# Patient Record
Sex: Female | Born: 1950 | Race: White | Hispanic: No | Smoking: Former smoker
Health system: Southern US, Community
[De-identification: ages and names within clinical notes are randomized; demographics above are authoritative.]

## PROBLEM LIST (undated history)

## (undated) DIAGNOSIS — T4145XA Adverse effect of unspecified anesthetic, initial encounter: Secondary | ICD-10-CM

## (undated) DIAGNOSIS — T8859XA Other complications of anesthesia, initial encounter: Secondary | ICD-10-CM

## (undated) DIAGNOSIS — T7840XA Allergy, unspecified, initial encounter: Secondary | ICD-10-CM

## (undated) DIAGNOSIS — M549 Dorsalgia, unspecified: Secondary | ICD-10-CM

## (undated) DIAGNOSIS — E785 Hyperlipidemia, unspecified: Secondary | ICD-10-CM

## (undated) DIAGNOSIS — G8929 Other chronic pain: Secondary | ICD-10-CM

## (undated) DIAGNOSIS — F329 Major depressive disorder, single episode, unspecified: Secondary | ICD-10-CM

## (undated) DIAGNOSIS — F419 Anxiety disorder, unspecified: Secondary | ICD-10-CM

## (undated) DIAGNOSIS — Z9889 Other specified postprocedural states: Secondary | ICD-10-CM

## (undated) DIAGNOSIS — G4700 Insomnia, unspecified: Secondary | ICD-10-CM

## (undated) DIAGNOSIS — K8020 Calculus of gallbladder without cholecystitis without obstruction: Secondary | ICD-10-CM

## (undated) DIAGNOSIS — F32A Depression, unspecified: Secondary | ICD-10-CM

## (undated) DIAGNOSIS — D649 Anemia, unspecified: Secondary | ICD-10-CM

## (undated) DIAGNOSIS — M1990 Unspecified osteoarthritis, unspecified site: Secondary | ICD-10-CM

## (undated) DIAGNOSIS — R001 Bradycardia, unspecified: Secondary | ICD-10-CM

## (undated) DIAGNOSIS — I10 Essential (primary) hypertension: Secondary | ICD-10-CM

## (undated) DIAGNOSIS — J40 Bronchitis, not specified as acute or chronic: Secondary | ICD-10-CM

## (undated) DIAGNOSIS — R112 Nausea with vomiting, unspecified: Secondary | ICD-10-CM

## (undated) DIAGNOSIS — K5790 Diverticulosis of intestine, part unspecified, without perforation or abscess without bleeding: Secondary | ICD-10-CM

## (undated) HISTORY — PX: HERNIA REPAIR: SHX51

## (undated) HISTORY — DX: Insomnia, unspecified: G47.00

## (undated) HISTORY — DX: Allergy, unspecified, initial encounter: T78.40XA

## (undated) HISTORY — PX: APPENDECTOMY: SHX54

## (undated) HISTORY — PX: OTHER SURGICAL HISTORY: SHX169

## (undated) HISTORY — DX: Bradycardia, unspecified: R00.1

## (undated) HISTORY — PX: KNEE SURGERY: SHX244

## (undated) HISTORY — PX: TUBAL LIGATION: SHX77

## (undated) HISTORY — DX: Hyperlipidemia, unspecified: E78.5

## (undated) HISTORY — PX: COLONOSCOPY: SHX174

## (undated) HISTORY — DX: Unspecified osteoarthritis, unspecified site: M19.90

## (undated) HISTORY — DX: Other specified postprocedural states: Z98.890

## (undated) HISTORY — DX: Major depressive disorder, single episode, unspecified: F32.9

## (undated) HISTORY — DX: Depression, unspecified: F32.A

---

## 1998-05-01 ENCOUNTER — Other Ambulatory Visit: Admission: RE | Admit: 1998-05-01 | Discharge: 1998-05-01 | Payer: Self-pay | Admitting: Obstetrics & Gynecology

## 1998-06-13 ENCOUNTER — Other Ambulatory Visit: Admission: RE | Admit: 1998-06-13 | Discharge: 1998-06-13 | Payer: Self-pay | Admitting: Gastroenterology

## 1999-07-11 ENCOUNTER — Other Ambulatory Visit: Admission: RE | Admit: 1999-07-11 | Discharge: 1999-07-11 | Payer: Self-pay | Admitting: Obstetrics & Gynecology

## 2000-10-12 ENCOUNTER — Other Ambulatory Visit: Admission: RE | Admit: 2000-10-12 | Discharge: 2000-10-12 | Payer: Self-pay | Admitting: Obstetrics & Gynecology

## 2001-11-09 ENCOUNTER — Other Ambulatory Visit: Admission: RE | Admit: 2001-11-09 | Discharge: 2001-11-09 | Payer: Self-pay | Admitting: Obstetrics & Gynecology

## 2003-01-19 ENCOUNTER — Other Ambulatory Visit: Admission: RE | Admit: 2003-01-19 | Discharge: 2003-01-19 | Payer: Self-pay | Admitting: Obstetrics and Gynecology

## 2003-12-11 DIAGNOSIS — Z9889 Other specified postprocedural states: Secondary | ICD-10-CM

## 2003-12-11 HISTORY — DX: Other specified postprocedural states: Z98.890

## 2004-03-04 ENCOUNTER — Other Ambulatory Visit: Admission: RE | Admit: 2004-03-04 | Discharge: 2004-03-04 | Payer: Self-pay | Admitting: Obstetrics & Gynecology

## 2004-08-27 ENCOUNTER — Encounter: Admission: RE | Admit: 2004-08-27 | Discharge: 2004-08-27 | Payer: Self-pay | Admitting: Family Medicine

## 2005-02-11 ENCOUNTER — Ambulatory Visit (HOSPITAL_COMMUNITY): Admission: RE | Admit: 2005-02-11 | Discharge: 2005-02-11 | Payer: Self-pay | Admitting: Interventional Cardiology

## 2005-05-05 ENCOUNTER — Other Ambulatory Visit: Admission: RE | Admit: 2005-05-05 | Discharge: 2005-05-05 | Payer: Self-pay | Admitting: Obstetrics & Gynecology

## 2005-08-11 ENCOUNTER — Encounter: Admission: RE | Admit: 2005-08-11 | Discharge: 2005-08-11 | Payer: Self-pay | Admitting: Family Medicine

## 2006-12-09 ENCOUNTER — Inpatient Hospital Stay (HOSPITAL_COMMUNITY): Admission: EM | Admit: 2006-12-09 | Discharge: 2006-12-13 | Payer: Self-pay | Admitting: Emergency Medicine

## 2006-12-09 ENCOUNTER — Encounter (INDEPENDENT_AMBULATORY_CARE_PROVIDER_SITE_OTHER): Payer: Self-pay | Admitting: General Surgery

## 2006-12-17 ENCOUNTER — Encounter: Admission: RE | Admit: 2006-12-17 | Discharge: 2006-12-17 | Payer: Self-pay | Admitting: General Surgery

## 2006-12-20 ENCOUNTER — Inpatient Hospital Stay (HOSPITAL_COMMUNITY): Admission: EM | Admit: 2006-12-20 | Discharge: 2006-12-25 | Payer: Self-pay | Admitting: General Surgery

## 2006-12-20 ENCOUNTER — Encounter: Admission: RE | Admit: 2006-12-20 | Discharge: 2006-12-20 | Payer: Self-pay | Admitting: General Surgery

## 2008-03-27 ENCOUNTER — Ambulatory Visit (HOSPITAL_COMMUNITY): Admission: RE | Admit: 2008-03-27 | Discharge: 2008-03-28 | Payer: Self-pay | Admitting: Neurological Surgery

## 2008-12-03 IMAGING — CT CT PELVIS W/ CM
2 of 5 series · 17 of 46 positions shown, 19 images · IV contrast (READICAT/WATER & [ID] OMNI 300)
Comparison: 12/08/2006

ABDOMEN CT WITH CONTRAST

CLINICAL DATA: Abdominal pain, fever. Two weeks following appendectomy.
TECHNIQUE: Multidetector CT imaging of the abdomen and pelvis was performed
following the standard protocol during bolus administration of intravenous
contrast.

Contrast:  100 cc Omnipaque 300

[Series 3: routine abdomen · axial · 0.70mm/px · z∈[-324,+61]mm · 14 of 87 slices shown, 16 images]
[im 5/87  soft-tissue]
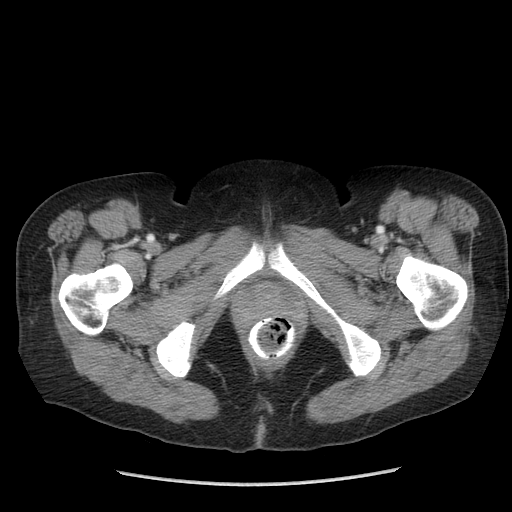
[im 5/87  bone]
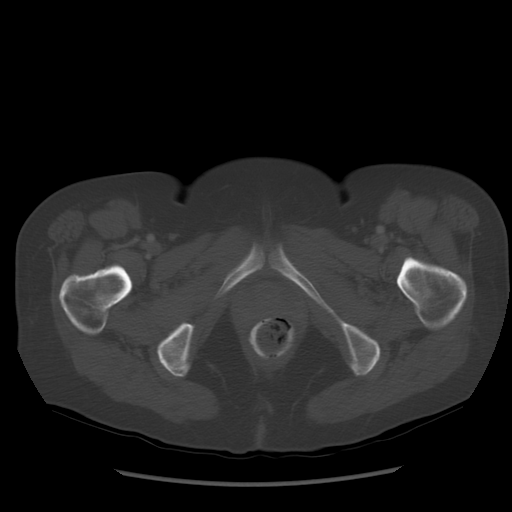
[im 10/87  soft-tissue]
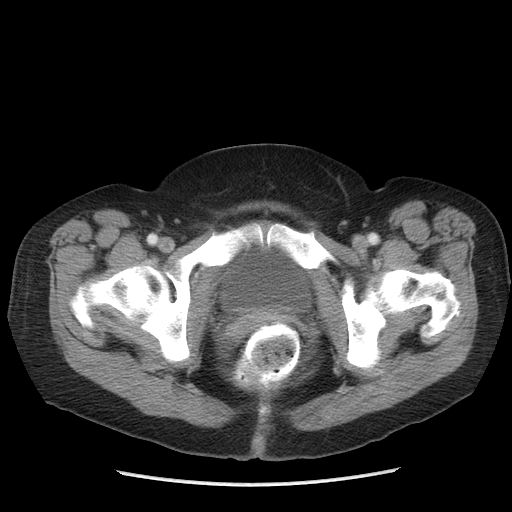
[im 20/87  soft-tissue]
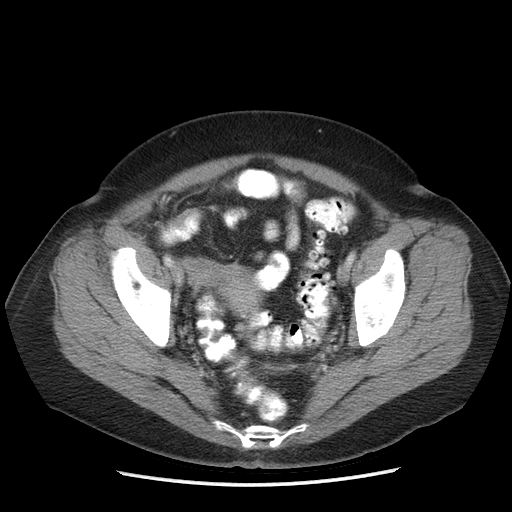
[im 24/87  soft-tissue]
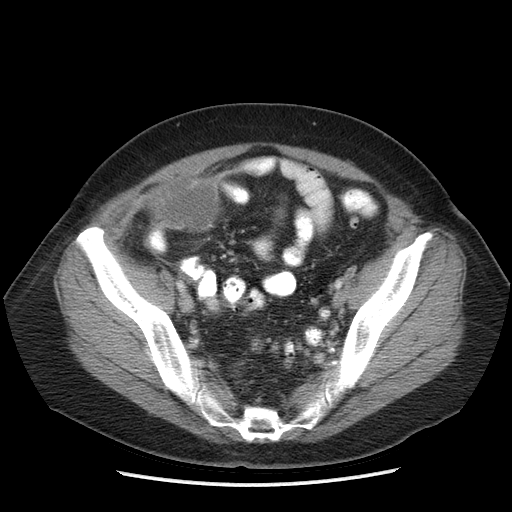
[im 29/87  soft-tissue]
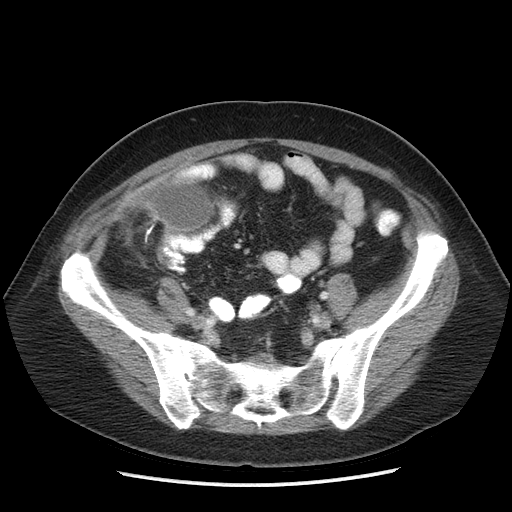
[im 34/87  soft-tissue]
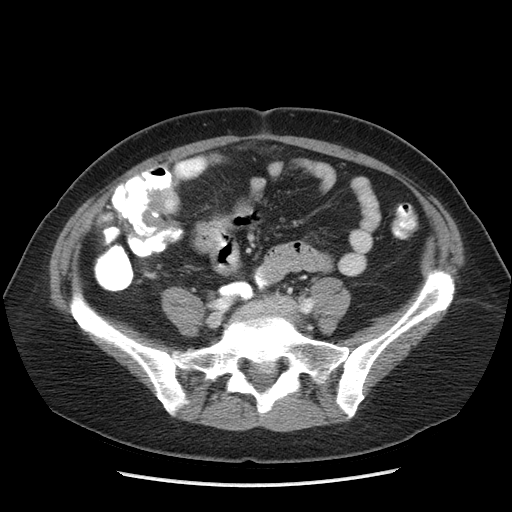
[im 39/87  soft-tissue]
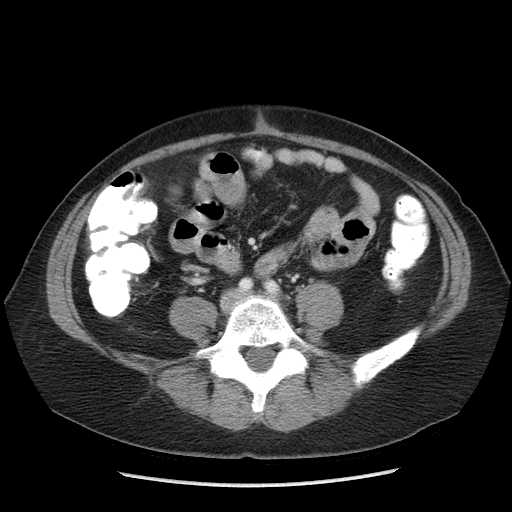
[im 48/87  soft-tissue]
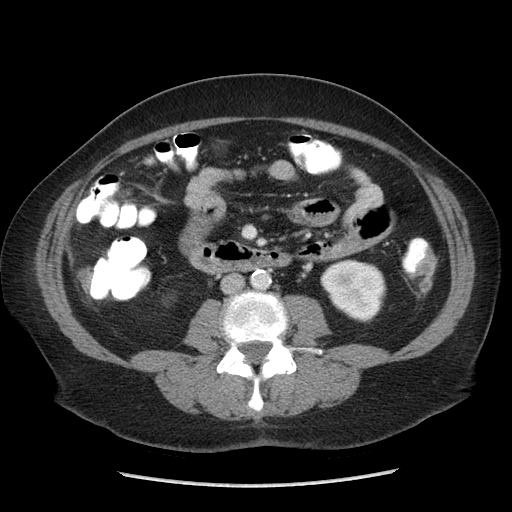
[im 53/87  soft-tissue]
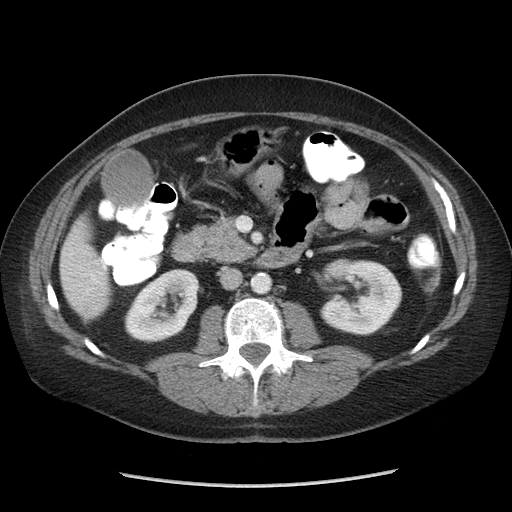
[im 53/87  bone]
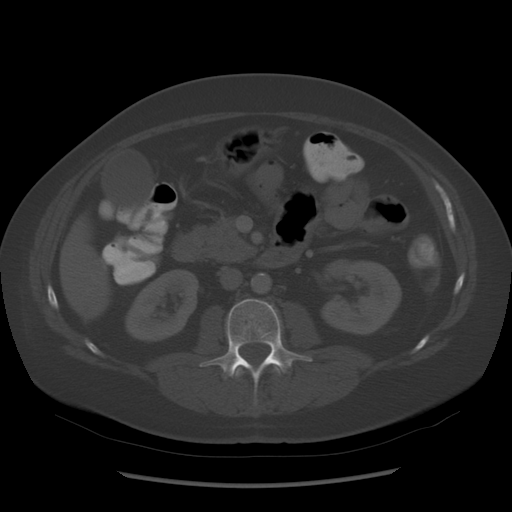
[im 58/87  soft-tissue]
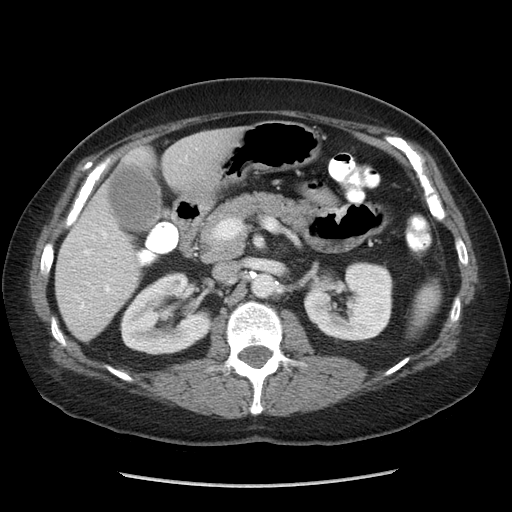
[im 63/87  soft-tissue]
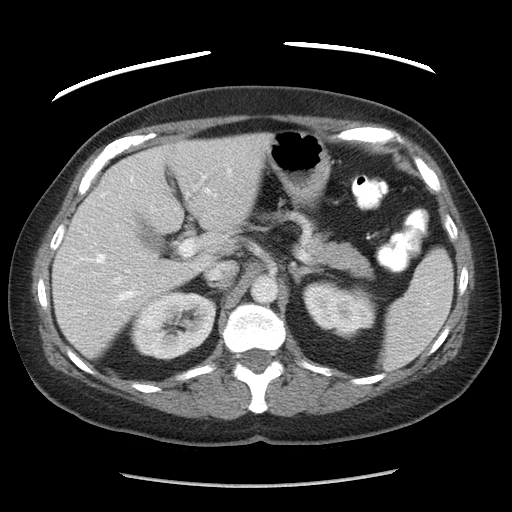
[im 67/87  soft-tissue]
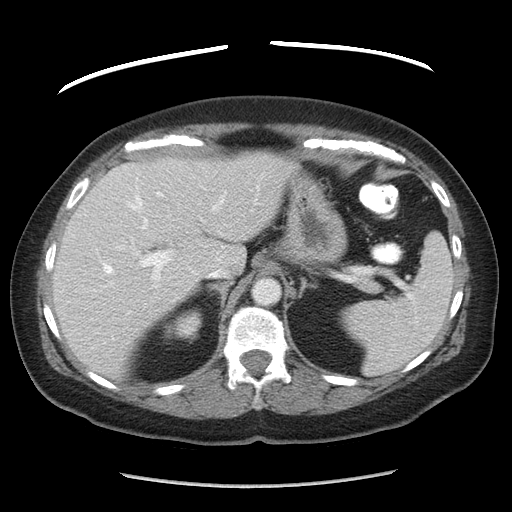
[im 77/87  soft-tissue]
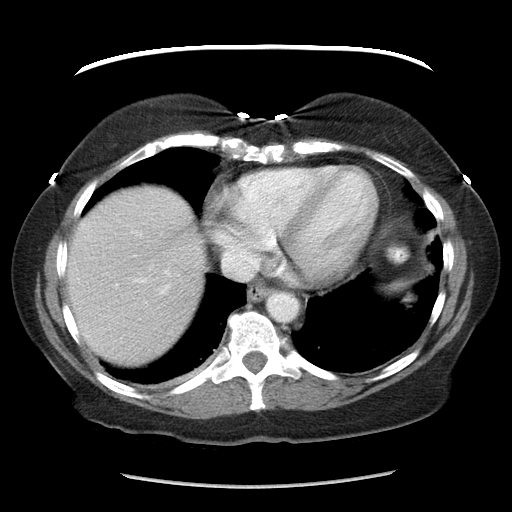
[im 82/87  soft-tissue]
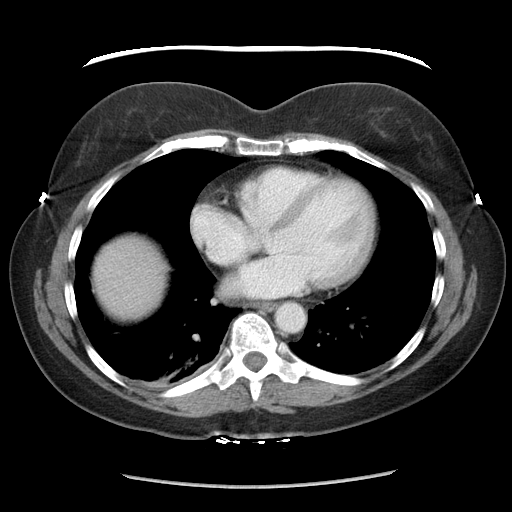

[Series 602: sagittal body · sagittal · 0.85mm/px · 3 of 145 slices shown]
[im 49/145  soft-tissue]
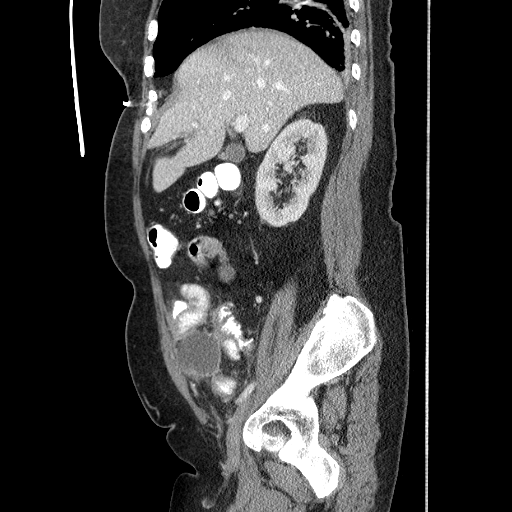
[im 65/145  soft-tissue]
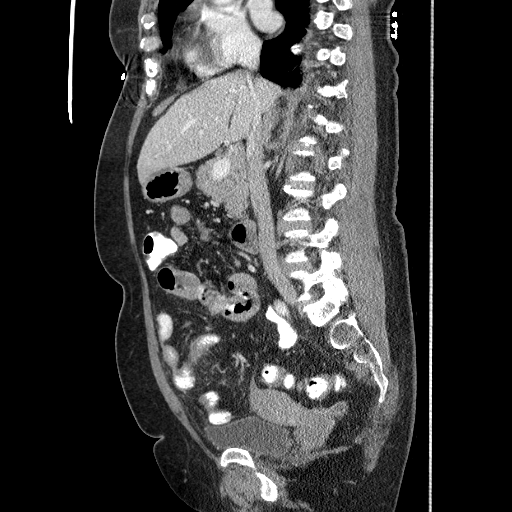
[im 81/145  soft-tissue]
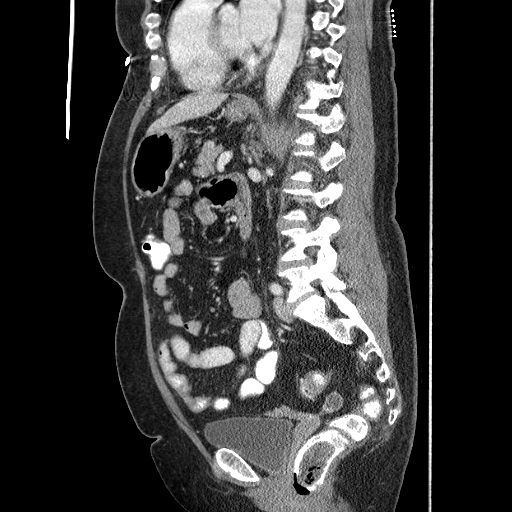

[17 of 46 positions shown; findings below may reference images not displayed]

FINDINGS: Liver, spleen, pancreas, adrenals, kidneys, gallbladder are
unremarkable. Bowel grossly unremarkable.

Immediately inferior to the liver, there is a tiny fluid collection with rim
enhancement, measuring 11 mm in greatest diameter. Similar tiny fluid collection
noted within the left paracolic gutter posterior to the descending colon,
measuring 11 mm in greatest diameter, with rim enhancement. These likely
represent small abscesses.

There is bibasilar atelectasis. No acute bony abnormality.

IMPRESSION

Tiny abscesses inferior to the right liver edge and in the left paracolic
gutter, both both measuring 11 mm in greatest diameter.

Bibasilar atelectasis.

PELVIS CT WITH CONTRAST
FINDINGS: There is an anterior right lower quadrant fluid collection, adjacent
to surgical suture line, which measures 6.1 x 3.1 cm, compatible with postop
abscess. Fluid collection is also seen in the cul-de-sac. This has the
suggestion of rim enhancement. This fluid collection measures 5.2 x 2.3 cm, and
could represent free fluid or developing abscess. Uterus is unremarkable. Bowel
grossly unremarkable except for sigmoid diverticulosis.

IMPRESSION

6.1 cm abscess in the right lower quadrant, adjacent to the surgical suture
line.

Fluid within the cul-de-sac. This is difficult to determine if this is a pocket
of free fluid or could represent a developing abscess.

Sigmoid diverticulosis.

## 2010-03-02 ENCOUNTER — Encounter: Payer: Self-pay | Admitting: Sports Medicine

## 2010-05-12 ENCOUNTER — Other Ambulatory Visit: Payer: Self-pay | Admitting: Obstetrics & Gynecology

## 2010-05-27 LAB — POCT I-STAT 4, (NA,K, GLUC, HGB,HCT)
Glucose, Bld: 104 mg/dL — ABNORMAL HIGH (ref 70–99)
HCT: 36 % (ref 36.0–46.0)
Hemoglobin: 12.2 g/dL (ref 12.0–15.0)
Potassium: 3.9 mEq/L (ref 3.5–5.1)
Sodium: 144 mEq/L (ref 135–145)

## 2010-05-27 LAB — CBC
HCT: 39.9 % (ref 36.0–46.0)
Hemoglobin: 14.1 g/dL (ref 12.0–15.0)
MCHC: 35.3 g/dL (ref 30.0–36.0)
MCV: 87.7 fL (ref 78.0–100.0)
Platelets: 229 10*3/uL (ref 150–400)
RBC: 4.55 MIL/uL (ref 3.87–5.11)
RDW: 13.4 % (ref 11.5–15.5)
WBC: 6.4 10*3/uL (ref 4.0–10.5)

## 2010-06-24 NOTE — H&P (Signed)
[name], [name]                ACCOUNT NO.:  [id number]   MEDICAL RECORD NO.:  [id number]          PATIENT TYPE:  INP   LOCATION:  1523                         FACILITY:  [facility]   PHYSICIAN:  Lennie Muckle, MD      DATE OF BIRTH:  12/03/1950   DATE OF ADMISSION:  12/20/2006  DATE OF DISCHARGE:                              HISTORY & PHYSICAL   The patient is a 60 year old female who began having fevers on Sunday  evening up to 102 that continued this Monday morning, December 21, 2006.  She had an appendectomy for necrotic appendix and appendicitis on  December 09, 2006.  She was given IV antibiotics and changed to oral  medications which she has concluded her Augmentin 875 mg  postoperatively.  She has vague abdominal pain and I had seen her in the  office on Friday due to complaints of right-sided pleuritic chest pain.  A chest x-ray revealed atelectasis only.  Due to the amount of her  fevers, I obtained a CT scan as an outpatient which revealed an abscess  in the vicinity of the previous appendectomy approximately 6 cm in size.   PAST MEDICAL HISTORY:  Previous appendicitis requiring appendectomy on  December 09, 2006.  History of depression and hyperlipidemia.   PAST SURGICAL HISTORY:  Appendectomy on December 09, 2006.  Bilateral  tubal ligation.   SOCIAL HISTORY:  No tobacco or alcohol use.   MEDICATIONS:  1. Lexapro.  2. Trazodone.  3. Lipitor.  4. Xanax.  5. Excedrin.  6. Ibuprofen.  7. Caltrate.  8. Fish Oil.  9. She has also been given MiraLax, Percocet, and Phenergan with her      last hospitalization.   REVIEW OF SYSTEMS:  Obtained from the patient.  No change since her last  hospitalization.   PHYSICAL EXAMINATION:  VITAL SIGNS:  Temperature currently 98.5, pulse  62, blood pressure 137/64.  GENERAL:  Overall she looks well and is in no acute distress.  ABDOMEN:  Soft.  Minimal tenderness in the right lower quadrant.  No  peritoneal signs.  SKIN:  Without  any abnormalities.   ASSESSMENT:  Postoperative abscess from previous appendectomy, 6 cm in  size.  I will have interventional radiology place percutaneous drainage  of the abscess, place her on IV antibiotics.  Hopefully she will be able  to be discharged home with her drain in the next couple of days once her  fevers have resolved and will have drained her abscess.      Lennie Muckle, MD  Electronically Signed     ALA/MEDQ  D:  12/20/2006  T:  12/21/2006  Job:  [id number]

## 2010-06-24 NOTE — Discharge Summary (Signed)
NAMESAVAYA, HAKES                ACCOUNT NO.:  [id number]   MEDICAL RECORD NO.:  [id number]          PATIENT TYPE:  INP   LOCATION:  1342                         FACILITY:  [facility]   PHYSICIAN:  Lennie Muckle, MD      DATE OF BIRTH:  May 23, 1950   DATE OF ADMISSION:  12/09/2006  DATE OF DISCHARGE:  12/13/2006                               DISCHARGE SUMMARY   DIAGNOSIS:  Appendicitis with a necrotic appendix status post  laparoscopic appendectomy.   [name] Riley is a 60 year old female who was admitted following a  laparoscopic appendectomy performed on 12/11/06.  She had presented to  the emergency room on 12/09/06 with abdominal pain, was found to have  acute appendicitis.  Following her procedure, she was kept on Zosyn IV  due to the necrotic nature of the appendix and fluid within her abdomen.  She has progressed nicely, becoming afebrile, and her white count is  down to 10.9.  She did have a postoperative ileus but is eating well  today without incident.  Pain has been fairly well controlled.  I will  DC her home on Percocet.  Instructed her to take MiraLax for aid with  constipation, give her 5 days of Augmentin 875 mg, and she will follow  up with me in approximately 2 or 3 weeks.  She is released from work  until November the 12th.      Lennie Muckle, MD  Electronically Signed     ALA/MEDQ  D:  12/13/2006  T:  12/14/2006  Job:  [id number]

## 2010-06-24 NOTE — Op Note (Signed)
[name], [name]                ACCOUNT NO.:  [id number]   MEDICAL RECORD NO.:  [id number]          PATIENT TYPE:  OIB   LOCATION:  3599                         FACILITY:  [facility]   PHYSICIAN:  Stefani Dama, M.D.  DATE OF BIRTH:  1950-12-16   DATE OF PROCEDURE:  03/27/2008  DATE OF DISCHARGE:                               OPERATIVE REPORT   PREOPERATIVE DIAGNOSIS:  Herniated nucleus pulposus, C6-C7 right with  the right cervical radiculopathy.   POSTOPERATIVE DIAGNOSIS:  Herniated nucleus pulposus, C6-C7 right with  the right cervical radiculopathy.   PROCEDURE:  Sitting cervical laminotomy and diskectomy, C6-C7 with  METRx, operating microscope, microdissection technique.   SURGEON:  Stefani Dama, MD   FIRST ASSISTANT:  Danae Orleans. Venetia Maxon, MD   ANESTHESIA:  General endotracheal.   INDICATIONS:  [name] Riley is a 60 year old individual who has had  significant neck, shoulder, and right arm pain and weakness.  She has  evidence of a herniated nucleus pulposus at C6-C7 on the right side.  She has been having right arm pain and weakness now for 2 months time,  despite efforts at conservative management.  She is advised regarding  surgical intervention.   PROCEDURE IN DETAIL:  The patient was brought to the operating room,  placed on table in supine position.  After placement of appropriate  arterial and central venous monitoring lines, she was placed under  general anesthesia and then carefully placed in the sitting position in  a 3-pin headrest.  Back of the neck was then prepped with alcohol and  DuraPrep and localization was obtained with fluoroscopy at the C6-C7  level.  The skin was infiltrated with a total of 15 mL of 0.5% Marcaine,  1% lidocaine with epinephrine, and a small vertical incision was created  overlying the area of C6-C7.  K-wire was placed to the interlaminar  space at C6-C7 on the right side overlying the facet joints.  A winding  technique was then  used to dilate and resect the soft tissues overlying  the interlaminar space and then a series of dilators were used to open  this to an 18-mm diameter.  A 16-mm wide x 6-cm deep endoscopic cannula  was then fixed to the operating table with a clamp and operating  microscope was used then to dissect the interlaminar space at C6-C7 off  to the right side.  Soft tissues were cleared with a monopolar cautery  and then a high-speed drill was used to create a small laminotomy, the  medial facetectomy at C6-C7 level.  The C7 nerve root was identified and  carefully cauterized.  The area was then inspected and with the use of  the operating microscope some epidural veins were cauterized and  divided.  The C7 nerve root was then carefully protected and elevated.  This was noted be a substantial mass underneath the ligament.  By  providing some retraction on the nerve root superiorly and medially, the  disk space could be opened with a #15 blade and immediately under  moderate amount of pressure, some disk material was extruded itself.  This was removed with a micro pituitary and further dissection in this  area yielded several other fragments of disk that presented themselves  through this small opening.  Ultimately, this allowed for good  decompression of the C7 nerve root.  Once all the disk material was  removed from this area, that is all the disk material that could be  milked back from the subligamentous space both medially and laterally,  it was decided to terminate the procedure.  The C7 nerve root was noted  to lie more comfortably in the foramen.  There was no pressure from the  undersurface of the nerve root.  With this, the microscope was removed,  the endoscopic cannula was removed, the soft tissues were then checked  for hemostasis, and then the subcutaneous  tissue was closed with 3-0 Vicryl interrupted fashion, and 3-0 Vicryl  was used in the subcuticular tissues.  Dermabond was  placed on the skin.  The patient tolerated the procedure well and was returned to the  recovery room in stable condition.      Stefani Dama, M.D.  Electronically Signed     HJE/MEDQ  D:  03/27/2008  T:  03/27/2008  Job:  [id number]

## 2010-06-24 NOTE — Op Note (Signed)
[name] Riley, [name] Riley                ACCOUNT NO.:  [id number]   MEDICAL RECORD NO.:  [id number]          PATIENT TYPE:  EMS   LOCATION:  ED                           FACILITY:  [facility]   PHYSICIAN:  Lennie Muckle, MD      DATE OF BIRTH:  1950/11/16   DATE OF PROCEDURE:  12/09/2006  DATE OF DISCHARGE:                               OPERATIVE REPORT   PREOPERATIVE DIAGNOSIS:  Acute appendicitis.   POSTOPERATIVE DIAGNOSIS:  Acute appendicitis.   PROCEDURE:  Laparoscopic appendectomy.   SURGEON:  Lennie Muckle, M.D.   ASSISTANT:  None.   SPECIMENS:  Appendix.   ANESTHESIA:  General endotracheal.   ESTIMATED BLOOD LOSS:  Minimal.   COMPLICATIONS:  None immediate.   DRAINS:  None.   INDICATIONS FOR PROCEDURE:  The patient is a 60 year old female who  presented to the emergency department with right lower quadrant pain.  Clinical examination as well as CT findings indicated acute  appendicitis.  Informed consent was obtained.   DESCRIPTION OF PROCEDURE:  The patient was identified in the  preoperative holding suite.  She was then taken to the operating room.  She was placed under general endotracheal anesthesia.  A timeout  procedure was performed indicating the patient and the procedure.  She  was given Zosyn preoperatively in the emergency room at approximately  11:30.  She was taken to the operating room at approximately 1 a.m.  Her  abdomen was prepped and draped in the usual sterile fashion.  Due to the  previous incision inferiorly around the umbilicus, I chose an area  superior.  I anesthetized the skin with 0.25% Marcaine and 1% lidocaine.  After incision, placed a #12 mm trocar with direct visualization with  OptiVu.  Upon entry into the abdomen, there did appear to be an adhesion  close to the umbilical port.  No other adhesions were noted.  There was  inflammation in the right lower quadrant.  A 5 mm trocar was placed in  the left lower quadrant.  An additional 5 mm  trocar placed in the right  upper quadrant.  Upon further inspection around the cecal area, there  was purulent amount of fluid.  The appendix was able to be visualized  with a central area of necrosis.  There was also inflammatory reaction  to the small intestine in the vicinity of the appendix.  Using blunt  dissection, the base of the appendix was freed to allow laparoscopic  stapler to be applied at the base of the appendix.  The mesentery was  also stapled with laparoscopic stapling device.  The appendix was placed  in an EndoCatch bag and removed from the umbilical port.  The abdomen  was irrigated with 3 liters of saline until no further purulent fluid  was noted in the pelvis or in the right side of the abdomen.  There was  no evidence of  bleeding at the end of the case.  The fascia was closed with 0 Vicryl  suture.  The skin was closed with 4-0 Monocryl.  Steri-Strips were  placed for  final dressing.  The patient was extubated and transported to  the postanesthesia care unit in stable condition.      Lennie Muckle, MD  Electronically Signed     ALA/MEDQ  D:  12/09/2006  T:  12/09/2006  Job:  [id number]

## 2010-06-24 NOTE — Discharge Summary (Signed)
NAMELUCERITO, ROSINSKI                ACCOUNT NO.:  [id number]   MEDICAL RECORD NO.:  [id number]          PATIENT TYPE:  INP   LOCATION:  1523                         [facility]ACILITY:  [facility]   PHYSICIAN:  Lennie Muckle, MD      DATE OF BIRTH:  01-Feb-1951   DATE OF ADMISSION:  12/20/2006  DATE OF DISCHARGE:  12/25/2006                               DISCHARGE SUMMARY   DIAGNOSIS:  Abdominal abscess following appendectomy.   Ms. Youse is a 60 year old female who had had fevers following 1 week  ago to 102.  CT scan revealed fluid collection in the right lower  quadrant adjacent to the previous appendectomy site.  She was admitted  for IV antibiotics, had a drain placed by interventional radiology on  December 21, 2006, has had successful management of her abscess with  drainage.  Repeat CT scan today on Saturday, November 15th, reveals no  residual fluid collection.  Drain will be discontinued by radiology.  She has had alleviation of her right-sided abdominal pain, has had no  fevers for the past several days, and white count is normal at 10.  She  will be discharged to continue on Levaquin and Flagyl for another 7  days.  She is instructed to follow up with me in approximately 2 weeks'  time.  She may shower.  No heavy lifting for 2 more weeks and then we  will reevaluate her restrictions when she comes in to clinic visit.  She  will continue all of her home medications of Lexapro, trazodone,  Lipitor, Xanax, MiraLax, Percocet, ibuprofen, Caltrate, and fish oil.      Lennie Muckle, MD  Electronically Signed     ALA/MEDQ  D:  12/25/2006  T:  12/26/2006  Job:  [id number]

## 2010-06-24 NOTE — H&P (Signed)
[name] Riley, [name] Riley                ACCOUNT NO.:  [id number]   MEDICAL RECORD NO.:  [id number]          PATIENT TYPE:  EMS   LOCATION:  ED                           FACILITY:  [facility]   PHYSICIAN:  Lennie Muckle, MD      DATE OF BIRTH:  August 26, 1950   DATE OF ADMISSION:  12/08/2006  DATE OF DISCHARGE:                              HISTORY & PHYSICAL   CHIEF COMPLAINT:  Abdominal pain.   Ms. Follmer is a 60 year old female who has had approximately 1-week  history of abdominal pain.  She states this started on Monday.  She  thought this was associated with the flu because she had nausea and  vomiting as well.  The pain worsened today along with fevers and chills.  She has some loose stools as well.  Due to her amount of abdomen pain,  as well as a chills, she came to the emergency department.  She also  reports having anorexia.  CT was obtained with findings of acute  appendicitis.   PAST MEDICAL HISTORY:  1. Anxiety.  2. Depression.  3. Hyperlipidemia.   PREVIOUS SURGICAL HISTORY:  She had bitubal ligations.   SOCIAL HISTORY:  No tobacco or alcohol use.   NO KNOWN DRUG ALLERGIES.   MEDICATIONS:  1. Lexapro.  2. Trazodone.  3. Lipitor.  4. Xanax.   REVIEW OF SYSTEMS:  Obtained from the patient.  Twelve-point system is  negative.  Per the patient, she has a diagnosis of bradycardia and has  apparently had a low blood pressure and heart rate following surgery in  the past.   PHYSICAL EXAMINATION:  Temperature is 101.  Pulse 68.  Respiratory rate  is 17.  Blood pressure 150/72.  She is lying in bed, is flushed, and is  not well appearing.  CHEST:  Clear to auscultation bilaterally.  CARDIOVASCULAR:  Regular rate and rhythm.  ABDOMEN:  Tender in palpation in the right lower quadrant with positive  peritoneal signs.  Positive Rovsing sign.  SKIN:  Flushed and hot to the touch.   CT, inflammation around the appendix with trace amount of fluid in the  pelvis.   LABS DRAWN IN  THE EMERGENCY ROOM:  White count was 16.6, hemoglobin and  hematocrit 14.3 and 40.2.  Serum chemistry is within normal limits.  Urinalysis is essentially negative.  She does have a small trace ketones  and protein is 30.   PLAN:  Appendicitis, receive IV antibiotics in the emergency department.   PLAN:  Laparoscopic appendectomy with discharge pending results of  operation.  I did discuss with the patient the possibility of a  postoperative abscess needing percutaneous drainage or IV antibiotics.      Lennie Muckle, MD  Electronically Signed     ALA/MEDQ  D:  12/08/2006  T:  12/09/2006  Job:  [id number]

## 2010-10-02 ENCOUNTER — Other Ambulatory Visit: Payer: Self-pay | Admitting: Family Medicine

## 2010-10-02 DIAGNOSIS — R109 Unspecified abdominal pain: Secondary | ICD-10-CM

## 2010-10-03 ENCOUNTER — Ambulatory Visit
Admission: RE | Admit: 2010-10-03 | Discharge: 2010-10-03 | Disposition: A | Discharge status: Home / Self Care | Payer: 59 | Source: Ambulatory Visit | Attending: Family Medicine | Admitting: Family Medicine

## 2010-10-03 DIAGNOSIS — R109 Unspecified abdominal pain: Secondary | ICD-10-CM

## 2010-11-18 LAB — CBC
HCT: 32.5 — ABNORMAL LOW
HCT: 33.1 — ABNORMAL LOW
Hemoglobin: 11.3 — ABNORMAL LOW
Hemoglobin: 11.6 — ABNORMAL LOW
MCHC: 34.9
MCHC: 34.9
MCV: 87.4
MCV: 88.2
Platelets: 565 — ABNORMAL HIGH
Platelets: 597 — ABNORMAL HIGH
RBC: 3.72 — ABNORMAL LOW
RBC: 3.76 — ABNORMAL LOW
RDW: 12
RDW: 12.5
WBC: 15.8 — ABNORMAL HIGH
WBC: 9.6

## 2010-11-18 LAB — CULTURE, ROUTINE-ABSCESS: Culture: NO GROWTH

## 2010-11-18 LAB — APTT: aPTT: 36

## 2010-11-18 LAB — ANAEROBIC CULTURE

## 2010-11-18 LAB — PROTIME-INR
INR: 1.1
Prothrombin Time: 14.3

## 2010-11-19 LAB — CBC
HCT: 37.5
HCT: 40.2
Hemoglobin: 12.9
Hemoglobin: 14.3
MCHC: 34.5
MCHC: 35.7
MCV: 87
MCV: 89.1
Platelets: 200
Platelets: 237
RBC: 4.21
RBC: 4.62
RDW: 12.4
RDW: 12.4
WBC: 10.9 — ABNORMAL HIGH
WBC: 16.6 — ABNORMAL HIGH

## 2010-11-19 LAB — URINALYSIS, ROUTINE W REFLEX MICROSCOPIC
Glucose, UA: NEGATIVE
Hgb urine dipstick: NEGATIVE
Nitrite: NEGATIVE
Protein, ur: 30 — AB
Specific Gravity, Urine: 1.026
Urobilinogen, UA: 0.2
pH: 7

## 2010-11-19 LAB — DIFFERENTIAL
Basophils Absolute: 0.1
Basophils Relative: 1
Eosinophils Absolute: 0
Eosinophils Relative: 0
Lymphocytes Relative: 7 — ABNORMAL LOW
Lymphs Abs: 1.1
Monocytes Absolute: 1.6 — ABNORMAL HIGH
Monocytes Relative: 10
Neutro Abs: 13.8 — ABNORMAL HIGH
Neutrophils Relative %: 83 — ABNORMAL HIGH

## 2010-11-19 LAB — COMPREHENSIVE METABOLIC PANEL
ALT: 32
AST: 22
Albumin: 3.9
Alkaline Phosphatase: 73
BUN: 6
CO2: 28
Calcium: 8.8
Chloride: 101
Creatinine, Ser: 0.76
GFR calc Af Amer: >60
GFR calc non Af Amer: >60
Glucose, Bld: 129 — ABNORMAL HIGH
Potassium: 4.1
Sodium: 138
Total Bilirubin: 2.5 — ABNORMAL HIGH
Total Protein: 6.9

## 2010-11-19 LAB — URINE MICROSCOPIC-ADD ON

## 2011-10-22 ENCOUNTER — Encounter (INDEPENDENT_AMBULATORY_CARE_PROVIDER_SITE_OTHER): Payer: Self-pay | Admitting: Surgery

## 2011-11-03 ENCOUNTER — Ambulatory Visit (INDEPENDENT_AMBULATORY_CARE_PROVIDER_SITE_OTHER): Payer: 59 | Admitting: Surgery

## 2011-11-03 ENCOUNTER — Encounter (INDEPENDENT_AMBULATORY_CARE_PROVIDER_SITE_OTHER): Payer: Self-pay | Admitting: Surgery

## 2011-11-03 VITALS — BP 120/78 | HR 60 | Temp 98.2°F | Resp 12 | Ht 62.0 in | Wt 165.0 lb

## 2011-11-03 DIAGNOSIS — K439 Ventral hernia without obstruction or gangrene: Secondary | ICD-10-CM | POA: Insufficient documentation

## 2011-11-03 NOTE — Progress Notes (Signed)
Patient ID: [name] Riley, female   DOB: 1950/07/06, 61 y.o.   MRN: [id number]  Chief Complaint  Patient presents with  . Incisional Hernia    HPI [name] Riley is a 61 y.o. female.  Referred by Dr. Merri Brunette HPI This is a 61 year old female who is status post bilateral tubal ligation through a small infraumbilical incision as well as a laparoscopic appendectomy for a perforated appendix in 2008 by Dr. Bertram Savin. She has developed a bulge above her umbilicus. This has enlarged and is starting to become more uncomfortable. She denies any obstructive symptoms. She has not had any imaging studies. She presents now for surgical evaluation.   Past Medical History  Diagnosis Date  . Hyperlipidemia   . Depression   . Allergy   . Insomnia   . DJD (degenerative joint disease)   . Bradycardia   . Hx of colonoscopy 12/2003    Past Surgical History  Procedure Date  . Btl   . Knee surgery     right knee  . Appendectomy   . Neck discectomy   . Tubal ligation     No family history on file.  Social History History  Substance Use Topics  . Smoking status: Former Smoker    Quit date: 05/09/1997  . Smokeless tobacco: Not on file  . Alcohol Use: No    Allergies  Allergen Reactions  . Codeine Nausea Only    Current Outpatient Prescriptions  Medication Sig Dispense Refill  . ALPRAZolam (XANAX) 0.25 MG tablet Take 0.25 mg by mouth at bedtime as needed.      Marland Kitchen amoxicillin (AMOXIL) 875 MG tablet Take 875 mg by mouth 2 (two) times daily.      Marland Kitchen atorvastatin (LIPITOR) 20 MG tablet Take 20 mg by mouth daily.      . Calcium Carbonate-Vitamin D (CALTRATE 600+D PO) Take by mouth.      . escitalopram (LEXAPRO) 20 MG tablet Take 20 mg by mouth daily.      . fish oil-omega-3 fatty acids 1000 MG capsule Take 2 g by mouth daily.      Marland Kitchen loratadine (CLARITIN) 10 MG tablet Take 10 mg by mouth daily.      . meloxicam (MOBIC) 15 MG tablet Take 15 mg by mouth daily.      . Multiple  Vitamins-Minerals (CENTRUM PO) Take by mouth.      . traZODone (DESYREL) 50 MG tablet Take 50 mg by mouth at bedtime.        Review of Systems Review of Systems  Constitutional: Negative for fever, chills and unexpected weight change.  HENT: Negative for hearing loss, congestion, sore throat, trouble swallowing and voice change.   Eyes: Negative for visual disturbance.  Respiratory: Negative for cough and wheezing.   Cardiovascular: Negative for chest pain, palpitations and leg swelling.  Gastrointestinal: Negative for nausea, vomiting, abdominal pain, diarrhea, constipation, blood in stool, abdominal distention and anal bleeding.  Genitourinary: Negative for hematuria, vaginal bleeding and difficulty urinating.  Musculoskeletal: Negative for arthralgias.  Skin: Negative for rash and wound.  Neurological: Negative for seizures, syncope and headaches.  Hematological: Negative for adenopathy. Does not bruise/bleed easily.  Psychiatric/Behavioral: Negative for confusion.    Blood pressure 120/78, pulse 60, temperature 98.2 F (36.8 C), temperature source Oral, resp. rate 12, height 5\' 2"  (1.575 m), weight 165 lb (74.844 kg).  Physical Exam Physical Exam WDWN in NAD HEENT:  EOMI, sclera anicteric Neck:  No masses, no thyromegaly Lungs:  CTA bilaterally; normal respiratory effort CV:  Regular rate and rhythm; no murmurs Abd:  +bowel sounds, soft, protruding mass above the umbilicus, as well as a smaller mass at the umbilicus.  There seems to be two separate fascial defects.  Both hernias are reducible. Ext:  Well-perfused; no edema Skin:  Warm, dry; no sign of jaundice  Data Reviewed none  Assessment    Ventral hernia - reducible    Plan    Ventral hernia repair with mesh.  The surgical procedure has been discussed with the patient.  Potential risks, benefits, alternative treatments, and expected outcomes have been explained.  All of the patient's questions at this time have  been answered.  The likelihood of reaching the patient's treatment goal is good.  The patient understand the proposed surgical procedure and wishes to proceed.        Mariadel Mruk K. 11/03/2011, 4:40 PM

## 2011-11-03 NOTE — Patient Instructions (Signed)
Call our surgery schedulers in October to schedule your surgery [phone].

## 2011-12-02 ENCOUNTER — Encounter (HOSPITAL_COMMUNITY): Payer: Self-pay | Admitting: Pharmacy Technician

## 2011-12-08 ENCOUNTER — Encounter (HOSPITAL_COMMUNITY): Payer: Self-pay

## 2011-12-08 ENCOUNTER — Encounter (HOSPITAL_COMMUNITY)
Admission: RE | Admit: 2011-12-08 | Discharge: 2011-12-08 | Disposition: A | Discharge status: Home / Self Care | Payer: 59 | Source: Ambulatory Visit | Attending: Surgery | Admitting: Surgery

## 2011-12-08 HISTORY — DX: Anemia, unspecified: D64.9

## 2011-12-08 HISTORY — DX: Other complications of anesthesia, initial encounter: T88.59XA

## 2011-12-08 HISTORY — DX: Diverticulosis of intestine, part unspecified, without perforation or abscess without bleeding: K57.90

## 2011-12-08 HISTORY — DX: Dorsalgia, unspecified: M54.9

## 2011-12-08 HISTORY — DX: Bronchitis, not specified as acute or chronic: J40

## 2011-12-08 HISTORY — DX: Adverse effect of unspecified anesthetic, initial encounter: T41.45XA

## 2011-12-08 HISTORY — DX: Other chronic pain: G89.29

## 2011-12-08 LAB — BASIC METABOLIC PANEL
BUN: 11 mg/dL (ref 6–23)
CO2: 29 mEq/L (ref 19–32)
Calcium: 9.6 mg/dL (ref 8.4–10.5)
Chloride: 106 mEq/L (ref 96–112)
Creatinine, Ser: 0.8 mg/dL (ref 0.50–1.10)
GFR calc Af Amer: 89 mL/min — ABNORMAL LOW (ref >90)
GFR calc non Af Amer: 77 mL/min — ABNORMAL LOW (ref >90)
Glucose, Bld: 127 mg/dL — ABNORMAL HIGH (ref 70–99)
Potassium: 3.7 mEq/L (ref 3.5–5.1)
Sodium: 145 mEq/L (ref 135–145)

## 2011-12-08 LAB — CBC
HCT: 39 % (ref 36.0–46.0)
Hemoglobin: 14.2 g/dL (ref 12.0–15.0)
MCH: 30.7 pg (ref 26.0–34.0)
MCHC: 36.4 g/dL — ABNORMAL HIGH (ref 30.0–36.0)
MCV: 84.4 fL (ref 78.0–100.0)
Platelets: 183 10*3/uL (ref 150–400)
RBC: 4.62 MIL/uL (ref 3.87–5.11)
RDW: 12.3 % (ref 11.5–15.5)
WBC: 6.4 10*3/uL (ref 4.0–10.5)

## 2011-12-08 LAB — SURGICAL PCR SCREEN
MRSA, PCR: NEGATIVE
Staphylococcus aureus: NEGATIVE

## 2011-12-08 MED ORDER — CHLORHEXIDINE GLUCONATE 4 % EX LIQD: 1.0000 "application " | Freq: Once | CUTANEOUS | Status: DC

## 2011-12-08 NOTE — Progress Notes (Signed)
Forwarded chart to anesthesia for review of EKG.

## 2011-12-08 NOTE — Pre-Procedure Instructions (Signed)
20 [name]  12/08/2011   Your procedure is scheduled on:  Thursday December 17, 2011  Report to [facility] at 7:00 AM.  Call this number if you have problems the morning of surgery: [phone]   Remember:   Do not eat food or drink :After Midnight.      Take these medicines the morning of surgery with A SIP OF WATER: xanax, zyrtec, lexapro,    Do not wear jewelry, make-up or nail polish.  Do not wear lotions, powders, or perfumes.  Do not shave 48 hours prior to surgery. Men may shave face and neck.  Do not bring valuables to the hospital.  Contacts, dentures or bridgework may not be worn into surgery.  Leave suitcase in the car. After surgery it may be brought to your room.  For patients admitted to the hospital, checkout time is 11:00 AM the day of discharge.   Patients discharged the day of surgery will not be allowed to drive home.  Name and phone number of your driver: family / friend  Special Instructions: Shower using CHG 2 nights before surgery and the night before surgery.  If you shower the day of surgery use CHG.  Use special wash - you have one bottle of CHG for all showers.  You should use approximately 1/3 of the bottle for each shower.   Please read over the following fact sheets that you were given: Pain Booklet, Coughing and Deep Breathing, MRSA Information and Surgical Site Infection Prevention

## 2011-12-09 NOTE — Consult Note (Signed)
Anesthesia Chart Review:  Patient is a 61 year old female scheduled for [facility] with mesh on 12/17/11 by Dr. Corliss Skains.  History includes HLD, former smoker, obesity, anemia, depression, chronic back pain, DJD, diverticulosis, tubal ligation, appendectomy.  She reports that she has bradycardia and is hard to wake up after anesthesia.  CBC and BMET on 12/08/11 noted.  EKG on 12/08/11 showed SB @ 54 bpm, anterior ST/T wave abnormality, consider ischemia.  It was not felt significantly changed since 03/21/08.  No CV symptoms were reported at PAT.  She has no known history of HTN, DM, or CAD.  If remains asymptomatic then anticipate she can proceed as planned.  Shonna Chock, PA-C

## 2011-12-16 MED ORDER — CEFAZOLIN SODIUM-DEXTROSE 2-3 GM-% IV SOLR
2.0000 g | INTRAVENOUS | Status: AC
  Administered 2011-12-17: 2 g via INTRAVENOUS
  Filled 2011-12-16: qty 50

## 2011-12-16 NOTE — H&P (Signed)
Patient ID: [name] Riley, female DOB: [birth year], 61 y.o. MRN: [id number]  Chief Complaint   Patient presents with   .  Incisional Hernia   HPI  [name] Riley is a 61 y.o. female. Referred by Dr. Merri Brunette  HPI  This is a 61 year old female who is status post bilateral tubal ligation through a small infraumbilical incision as well as a laparoscopic appendectomy for a perforated appendix in 2008 by Dr. Bertram Savin. She has developed a bulge above her umbilicus. This has enlarged and is starting to become more uncomfortable. She denies any obstructive symptoms. She has not had any imaging studies. She presents now for surgical evaluation.  Past Medical History   Diagnosis  Date   .  Hyperlipidemia    .  Depression    .  Allergy    .  Insomnia    .  DJD (degenerative joint disease)    .  Bradycardia    .  Hx of colonoscopy  12/2003    Past Surgical History   Procedure  Date   .  Btl    .  Knee surgery      right knee   .  Appendectomy    .  Neck discectomy    .  Tubal ligation    No family history on file.  Social History  History   Substance Use Topics   .  Smoking status:  Former Smoker     Quit date:  05/09/1997   .  Smokeless tobacco:  Not on file   .  Alcohol Use:  No    Allergies   Allergen  Reactions   .  Codeine  Nausea Only    Current Outpatient Prescriptions   Medication  Sig  Dispense  Refill   .  ALPRAZolam (XANAX) 0.25 MG tablet  Take 0.25 mg by mouth at bedtime as needed.     Marland Kitchen  amoxicillin (AMOXIL) 875 MG tablet  Take 875 mg by mouth 2 (two) times daily.     Marland Kitchen  atorvastatin (LIPITOR) 20 MG tablet  Take 20 mg by mouth daily.     .  Calcium Carbonate-Vitamin D (CALTRATE 600+D PO)  Take by mouth.     .  escitalopram (LEXAPRO) 20 MG tablet  Take 20 mg by mouth daily.     .  fish oil-omega-3 fatty acids 1000 MG capsule  Take 2 g by mouth daily.     Marland Kitchen  loratadine (CLARITIN) 10 MG tablet  Take 10 mg by mouth daily.     .  meloxicam (MOBIC) 15 MG tablet  Take  15 mg by mouth daily.     .  Multiple Vitamins-Minerals (CENTRUM PO)  Take by mouth.     .  traZODone (DESYREL) 50 MG tablet  Take 50 mg by mouth at bedtime.     Review of Systems  Review of Systems  Constitutional: Negative for fever, chills and unexpected weight change.  HENT: Negative for hearing loss, congestion, sore throat, trouble swallowing and voice change.  Eyes: Negative for visual disturbance.  Respiratory: Negative for cough and wheezing.  Cardiovascular: Negative for chest pain, palpitations and leg swelling.  Gastrointestinal: Negative for nausea, vomiting, abdominal pain, diarrhea, constipation, blood in stool, abdominal distention and anal bleeding.  Genitourinary: Negative for hematuria, vaginal bleeding and difficulty urinating.  Musculoskeletal: Negative for arthralgias.  Skin: Negative for rash and wound.  Neurological: Negative for seizures, syncope and headaches.  Hematological: Negative for adenopathy. Does  not bruise/bleed easily.  Psychiatric/Behavioral: Negative for confusion.  Blood pressure 120/78, pulse 60, temperature 98.2 F (36.8 C), temperature source Oral, resp. rate 12, height 5\' 2"  (1.575 m), weight 165 lb (74.844 kg).  Physical Exam  Physical Exam  WDWN in NAD  HEENT: EOMI, sclera anicteric  Neck: No masses, no thyromegaly  Lungs: CTA bilaterally; normal respiratory effort  CV: Regular rate and rhythm; no murmurs  Abd: +bowel sounds, soft, protruding mass above the umbilicus, as well as a smaller mass at the umbilicus. There seems to be two separate fascial defects. Both hernias are reducible.  Ext: Well-perfused; no edema  Skin: Warm, dry; no sign of jaundice  Data Reviewed  none  Assessment  Ventral hernia - reducible  Plan  Ventral hernia repair with mesh. The surgical procedure has been discussed with the patient. Potential risks, benefits, alternative treatments, and expected outcomes have been explained. All of the patient's questions at  this time have been answered. The likelihood of reaching the patient's treatment goal is good. The patient understand the proposed surgical procedure and wishes to proceed.    Wilmon Arms. Corliss Skains, MD, [facility] Surgery  12/16/2011 8:38 PM

## 2011-12-17 ENCOUNTER — Encounter (HOSPITAL_COMMUNITY): Payer: Self-pay | Admitting: Vascular Surgery

## 2011-12-17 ENCOUNTER — Encounter (HOSPITAL_COMMUNITY): Payer: Self-pay | Admitting: *Deleted

## 2011-12-17 ENCOUNTER — Ambulatory Visit (HOSPITAL_COMMUNITY)
Admission: RE | Admit: 2011-12-17 | Discharge: 2011-12-17 | Disposition: A | Discharge status: Home / Self Care | Payer: 59 | Source: Ambulatory Visit | Attending: Surgery | Admitting: Surgery

## 2011-12-17 ENCOUNTER — Encounter (HOSPITAL_COMMUNITY)
Admission: RE | Disposition: A | Discharge status: Home / Self Care | Payer: Self-pay | Source: Ambulatory Visit | Attending: Surgery

## 2011-12-17 ENCOUNTER — Ambulatory Visit (HOSPITAL_COMMUNITY): Payer: 59 | Admitting: Vascular Surgery

## 2011-12-17 DIAGNOSIS — K439 Ventral hernia without obstruction or gangrene: Secondary | ICD-10-CM

## 2011-12-17 DIAGNOSIS — M1990 Unspecified osteoarthritis, unspecified site: Secondary | ICD-10-CM | POA: Insufficient documentation

## 2011-12-17 DIAGNOSIS — Z01810 Encounter for preprocedural cardiovascular examination: Secondary | ICD-10-CM | POA: Insufficient documentation

## 2011-12-17 DIAGNOSIS — Z01812 Encounter for preprocedural laboratory examination: Secondary | ICD-10-CM | POA: Insufficient documentation

## 2011-12-17 DIAGNOSIS — E785 Hyperlipidemia, unspecified: Secondary | ICD-10-CM | POA: Insufficient documentation

## 2011-12-17 HISTORY — PX: INSERTION OF MESH: SHX5868

## 2011-12-17 HISTORY — PX: VENTRAL HERNIA REPAIR: SHX424

## 2011-12-17 SURGERY — REPAIR, HERNIA, VENTRAL
Anesthesia: General | Site: Abdomen | Wound class: Clean

## 2011-12-17 MED ORDER — KETOROLAC TROMETHAMINE 30 MG/ML IJ SOLN
INTRAMUSCULAR | Status: DC | PRN
  Administered 2011-12-17: 30 mg via INTRAVENOUS

## 2011-12-17 MED ORDER — LACTATED RINGERS IV SOLN
INTRAVENOUS | Status: DC
  Administered 2011-12-17: 09:00:00 via INTRAVENOUS

## 2011-12-17 MED ORDER — FENTANYL CITRATE 0.05 MG/ML IJ SOLN
INTRAMUSCULAR | Status: AC
  Filled 2011-12-17: qty 2

## 2011-12-17 MED ORDER — OXYCODONE-ACETAMINOPHEN 5-325 MG PO TABS
ORAL_TABLET | ORAL | Status: AC
  Administered 2011-12-17: 2
  Filled 2011-12-17: qty 2

## 2011-12-17 MED ORDER — LIDOCAINE HCL (CARDIAC) 20 MG/ML IV SOLN
INTRAVENOUS | Status: DC | PRN
  Administered 2011-12-17: 60 mg via INTRAVENOUS

## 2011-12-17 MED ORDER — MIDAZOLAM HCL 5 MG/5ML IJ SOLN
INTRAMUSCULAR | Status: DC | PRN
  Administered 2011-12-17: 2 mg via INTRAVENOUS

## 2011-12-17 MED ORDER — BUPIVACAINE-EPINEPHRINE 0.25% -1:200000 IJ SOLN
INTRAMUSCULAR | Status: DC | PRN
  Administered 2011-12-17: 30 mL

## 2011-12-17 MED ORDER — PROPOFOL 10 MG/ML IV BOLUS
INTRAVENOUS | Status: DC | PRN
  Administered 2011-12-17: 200 mg via INTRAVENOUS

## 2011-12-17 MED ORDER — PROMETHAZINE HCL 25 MG PO TABS: 25.0000 mg | ORAL_TABLET | Freq: Four times a day (QID) | ORAL | Status: DC | PRN

## 2011-12-17 MED ORDER — ROCURONIUM BROMIDE 100 MG/10ML IV SOLN
INTRAVENOUS | Status: DC | PRN
  Administered 2011-12-17: 50 mg via INTRAVENOUS

## 2011-12-17 MED ORDER — FENTANYL CITRATE 0.05 MG/ML IJ SOLN
INTRAMUSCULAR | Status: DC | PRN
  Administered 2011-12-17: 50 ug via INTRAVENOUS
  Administered 2011-12-17: 100 ug via INTRAVENOUS
  Administered 2011-12-17: 50 ug via INTRAVENOUS

## 2011-12-17 MED ORDER — LACTATED RINGERS IV SOLN
INTRAVENOUS | Status: DC | PRN
  Administered 2011-12-17 (×2): via INTRAVENOUS

## 2011-12-17 MED ORDER — HYDROMORPHONE HCL PF 1 MG/ML IJ SOLN
INTRAMUSCULAR | Status: AC
  Filled 2011-12-17: qty 2

## 2011-12-17 MED ORDER — 0.9 % SODIUM CHLORIDE (POUR BTL) OPTIME
TOPICAL | Status: DC | PRN
  Administered 2011-12-17: 1000 mL

## 2011-12-17 MED ORDER — OXYCODONE-ACETAMINOPHEN 5-325 MG PO TABS: 1.0000 | ORAL_TABLET | ORAL | Status: DC | PRN

## 2011-12-17 MED ORDER — GLYCOPYRROLATE 0.2 MG/ML IJ SOLN
INTRAMUSCULAR | Status: DC | PRN
  Administered 2011-12-17: 0.1 mg via INTRAVENOUS
  Administered 2011-12-17: 0.4 mg via INTRAVENOUS

## 2011-12-17 MED ORDER — ONDANSETRON HCL 4 MG/2ML IJ SOLN
INTRAMUSCULAR | Status: DC | PRN
  Administered 2011-12-17: 4 mg via INTRAVENOUS

## 2011-12-17 MED ORDER — FENTANYL CITRATE 0.05 MG/ML IJ SOLN
25.0000 ug | INTRAMUSCULAR | Status: AC | PRN
  Administered 2011-12-17 (×2): 25 ug via INTRAVENOUS
  Administered 2011-12-17: 50 ug via INTRAVENOUS
  Administered 2011-12-17 (×2): 25 ug via INTRAVENOUS
  Administered 2011-12-17: 50 ug via INTRAVENOUS

## 2011-12-17 MED ORDER — NEOSTIGMINE METHYLSULFATE 1 MG/ML IJ SOLN
INTRAMUSCULAR | Status: DC | PRN
  Administered 2011-12-17: 3 mg via INTRAVENOUS

## 2011-12-17 MED ORDER — BUPIVACAINE-EPINEPHRINE PF 0.25-1:200000 % IJ SOLN
INTRAMUSCULAR | Status: AC
  Filled 2011-12-17: qty 30

## 2011-12-17 SURGICAL SUPPLY — 37 items
BENZOIN TINCTURE PRP APPL 2/3 (GAUZE/BANDAGES/DRESSINGS) ×2 IMPLANT
BLADE SURG ROTATE 9660 (MISCELLANEOUS) IMPLANT
CANISTER SUCTION 2500CC (MISCELLANEOUS) ×2 IMPLANT
CHLORAPREP W/TINT 26ML (MISCELLANEOUS) ×2 IMPLANT
CLOTH BEACON ORANGE TIMEOUT ST (SAFETY) ×2 IMPLANT
CLSR STERI-STRIP ANTIMIC 1/2X4 (GAUZE/BANDAGES/DRESSINGS) ×2 IMPLANT
COVER SURGICAL LIGHT HANDLE (MISCELLANEOUS) ×2 IMPLANT
DRAPE LAPAROSCOPIC ABDOMINAL (DRAPES) ×2 IMPLANT
DRAPE UTILITY 15X26 W/TAPE STR (DRAPE) ×4 IMPLANT
DRSG TEGADERM 4X4.75 (GAUZE/BANDAGES/DRESSINGS) ×2 IMPLANT
ELECT CAUTERY BLADE 6.4 (BLADE) ×2 IMPLANT
ELECT REM PT RETURN 9FT ADLT (ELECTROSURGICAL) ×2
ELECTRODE REM PT RTRN 9FT ADLT (ELECTROSURGICAL) ×1 IMPLANT
GLOVE BIO SURGEON STRL SZ7 (GLOVE) ×4 IMPLANT
GLOVE BIOGEL PI IND STRL 7.5 (GLOVE) ×2 IMPLANT
GLOVE BIOGEL PI INDICATOR 7.5 (GLOVE) ×2
GOWN STRL NON-REIN LRG LVL3 (GOWN DISPOSABLE) ×6 IMPLANT
KIT BASIN OR (CUSTOM PROCEDURE TRAY) ×2 IMPLANT
KIT ROOM TURNOVER OR (KITS) ×2 IMPLANT
NEEDLE HYPO 25GX1X1/2 BEV (NEEDLE) ×2 IMPLANT
NS IRRIG 1000ML POUR BTL (IV SOLUTION) ×2 IMPLANT
PACK GENERAL/GYN (CUSTOM PROCEDURE TRAY) ×2 IMPLANT
PAD ARMBOARD 7.5X6 YLW CONV (MISCELLANEOUS) ×4 IMPLANT
PATCH VENTRAL MEDIUM 6.4 (Mesh Specialty) ×2 IMPLANT
SPONGE GAUZE 4X4 12PLY (GAUZE/BANDAGES/DRESSINGS) ×2 IMPLANT
STAPLER VISISTAT 35W (STAPLE) IMPLANT
STRIP CLOSURE SKIN 1/2X4 (GAUZE/BANDAGES/DRESSINGS) IMPLANT
SUT MNCRL AB 4-0 PS2 18 (SUTURE) ×2 IMPLANT
SUT NOVA NAB GS-21 0 18 T12 DT (SUTURE) ×2 IMPLANT
SUT NOVA NAB GS-21 1 T12 (SUTURE) ×2 IMPLANT
SUT VIC AB 3-0 SH 27 (SUTURE) ×1
SUT VIC AB 3-0 SH 27XBRD (SUTURE) ×1 IMPLANT
SYR CONTROL 10ML LL (SYRINGE) ×2 IMPLANT
TOWEL OR 17X24 6PK STRL BLUE (TOWEL DISPOSABLE) ×2 IMPLANT
TOWEL OR 17X26 10 PK STRL BLUE (TOWEL DISPOSABLE) ×2 IMPLANT
TRAY FOLEY CATH 14FRSI W/METER (CATHETERS) IMPLANT
WATER STERILE IRR 1000ML POUR (IV SOLUTION) IMPLANT

## 2011-12-17 NOTE — Transfer of Care (Signed)
Immediate Anesthesia Transfer of Care Note  Patient: [name] Riley  Procedure(s) Performed: Procedure(s) (LRB) with comments: HERNIA REPAIR VENTRAL ADULT (N/A) INSERTION OF MESH (N/A)  Patient Location: PACU  Anesthesia Type:General  Level of Consciousness: awake, alert , oriented and patient cooperative  Airway & Oxygen Therapy: Patient Spontanous Breathing and Patient connected to nasal cannula oxygen  Post-op Assessment: Report given to PACU RN, Post -op Vital signs reviewed and stable and Patient moving all extremities  Post vital signs: Reviewed and stable  Complications: No apparent anesthesia complications

## 2011-12-17 NOTE — Anesthesia Preprocedure Evaluation (Addendum)
Anesthesia Evaluation  Patient identified by MRN, date of birth, ID band Patient awake    Reviewed: Allergy & Precautions, H&P , NPO status   History of Anesthesia Complications (+) PONV  Airway Mallampati: II      Dental   Pulmonary former smoker,          Cardiovascular negative cardio ROS  Rhythm:Regular Rate:Normal  Patient reports no heart history.   Neuro/Psych Depression    GI/Hepatic negative GI ROS, Neg liver ROS, History of ventral hernia noted.   Endo/Other  negative endocrine ROS  Renal/GU negative Renal ROS     Musculoskeletal   Abdominal   Peds  Hematology Past history of anemia.   Anesthesia Other Findings   Reproductive/Obstetrics                         Anesthesia Physical Anesthesia Plan  ASA: II  Anesthesia Plan: General   Post-op Pain Management:    Induction: Intravenous  Airway Management Planned: Oral ETT  Additional Equipment:   Intra-op Plan:   Post-operative Plan:   Informed Consent: I have reviewed the patients History and Physical, chart, labs and discussed the procedure including the risks, benefits and alternatives for the proposed anesthesia with the patient or authorized representative who has indicated his/her understanding and acceptance.     Plan Discussed with: CRNA, Anesthesiologist and Surgeon  Anesthesia Plan Comments:         Anesthesia Quick Evaluation

## 2011-12-17 NOTE — Preoperative (Signed)
Beta Blockers   Reason not to administer Beta Blockers:Not Applicable 

## 2011-12-17 NOTE — Anesthesia Postprocedure Evaluation (Signed)
  Anesthesia Post-op Note  Patient: [name] Riley  Procedure(s) Performed: Procedure(s) (LRB) with comments: HERNIA REPAIR VENTRAL ADULT (N/A) INSERTION OF MESH (N/A)  Patient Location: PACU  Anesthesia Type:General  Level of Consciousness: awake  Airway and Oxygen Therapy: Patient Spontanous Breathing  Post-op Pain: mild  Post-op Assessment: Post-op Vital signs reviewed  Post-op Vital Signs: Reviewed  Complications: No apparent anesthesia complications

## 2011-12-17 NOTE — Op Note (Signed)
Ventral Hernia Repair Procedure Note  Indications: Symptomatic ventral hernia  Pre-operative Diagnosis: Ventral hernia x2  Post-operative Diagnosis: Ventral hernia x2  Surgeon: Wynona Luna.   Assistants: none  Anesthesia: General endotracheal anesthesia  ASA Class: 2  Procedure Details  The patient was seen in the Holding Room. The risks, benefits, complications, treatment options, and expected outcomes were discussed with the patient. The possibilities of reaction to medication, pulmonary aspiration, perforation of viscus, bleeding, recurrent infection, the need for additional procedures, failure to diagnose a condition, and creating a complication requiring transfusion or operation were discussed with the patient. The patient concurred with the proposed plan, giving informed consent.  The site of surgery properly noted/marked. The patient was taken to the operating room, identified as [name] Riley and the procedure verified as ventral hernia repair. A Time Out was held and the above information confirmed.  The patient was placed supine.  After establishing general anesthesia, the abdomen was prepped with Chloraprep and draped in sterile fashion.  We made a vertical incision over the palpable hernia just above the umbilicus. Dissection was carried down to the hernia sac located above the fascia and mobilized from surrounding structures.  The hernia sac was opened and some omentum was reduced.  I palpated the surrounding fascia and felt another small defect behind the umbilicus.  The two defects were connected, creating a single 3.5 cm defect. Intact fascia was identified circumferentially around the defect.  We used a 6.4 cm Proceed ventral patch and secured this to the fascia with interrupted 0 Novofil sutures.  The fascial defect was reapproximated with interrupted figure-of-8 1 Novofil sutures.  The subcutaneous tissues were irrigated.  The skin incision was closed with a 4-0  subcuticular closure.  Hemostasis was confirmed.  The skin incision was closed in layers with a 4-0 Vicryl subcuticular closure.  Steri-Strips were applied at the end of the operation.    Instrument, sponge, and needle counts were correct prior to closure and at the conclusion of the case.   Findings: Two small adjacent fascial defects  Estimated Blood Loss:  Minimal         Drains: none                      Complications:  None; patient tolerated the procedure well.         Disposition: PACU - hemodynamically stable.         Condition: stable  Wilmon Arms. Corliss Skains, MD, [facility] Surgery  12/17/2011 10:53 AM

## 2011-12-17 NOTE — Anesthesia Procedure Notes (Signed)
Procedure Name: Intubation Date/Time: 12/17/2011 9:30 AM Performed by: Jerilee Hoh Pre-anesthesia Checklist: Patient identified, Emergency Drugs available, Suction available and Patient being monitored Patient Re-evaluated:Patient Re-evaluated prior to inductionOxygen Delivery Method: Circle system utilized Preoxygenation: Pre-oxygenation with 100% oxygen Intubation Type: IV induction Ventilation: Mask ventilation without difficulty and Oral airway inserted - appropriate to patient size Laryngoscope Size: Mac and 3 Grade View: Grade II Tube type: Oral Tube size: 7.5 mm Number of attempts: 1 Airway Equipment and Method: Stylet Placement Confirmation: ETT inserted through vocal cords under direct vision,  positive ETCO2 and breath sounds checked- equal and bilateral Secured at: 21 cm Tube secured with: Tape Dental Injury: Teeth and Oropharynx as per pre-operative assessment

## 2011-12-18 ENCOUNTER — Telehealth (INDEPENDENT_AMBULATORY_CARE_PROVIDER_SITE_OTHER): Payer: Self-pay | Admitting: General Surgery

## 2011-12-18 ENCOUNTER — Encounter (HOSPITAL_COMMUNITY): Payer: Self-pay | Admitting: Surgery

## 2011-12-18 NOTE — Telephone Encounter (Signed)
Pt's daughter called to report that the promethazine does not help with nausea when taking the percocet. Could another medication be used? I reviewed this with Dr. Leonard Schwartz. [place] and he ordered Zofran 4mg  #12 ,1 q6hrs prn pain, also if patient continues to have nausea and vomiting she should go to Emergency Room/ I reviewed instructions with daughter and called medication to [facility]/ [phone]

## 2012-01-12 ENCOUNTER — Ambulatory Visit (INDEPENDENT_AMBULATORY_CARE_PROVIDER_SITE_OTHER): Payer: 59 | Admitting: Surgery

## 2012-01-12 ENCOUNTER — Encounter (INDEPENDENT_AMBULATORY_CARE_PROVIDER_SITE_OTHER): Payer: Self-pay | Admitting: Surgery

## 2012-01-12 VITALS — BP 142/86 | HR 83 | Temp 97.7°F | Resp 14 | Ht 62.0 in | Wt 168.6 lb

## 2012-01-12 DIAGNOSIS — K439 Ventral hernia without obstruction or gangrene: Secondary | ICD-10-CM

## 2012-01-12 NOTE — Progress Notes (Signed)
Status post open repair of periumbilical ventral hernia on 12/17/11. This was repaired with a large proceed ventral patch. The patient doing quite well. She has some firm scar tissue in her incision. A small amount of the superficial skin in the umbilicus has sloughed but there is no sign of infection. No skin opening. The wound appears dry. No sign of recurrent hernia. The patient may begin increasing her level of activity over the next 2 weeks and may resume full activity on 1216. Followup as needed.  Wilmon Arms. Corliss Skains, MD, [facility] Surgery  01/12/2012 1:57 PM

## 2013-04-27 ENCOUNTER — Encounter: Payer: Self-pay | Admitting: Podiatry

## 2013-04-27 ENCOUNTER — Ambulatory Visit (INDEPENDENT_AMBULATORY_CARE_PROVIDER_SITE_OTHER): Payer: 59

## 2013-04-27 ENCOUNTER — Ambulatory Visit (INDEPENDENT_AMBULATORY_CARE_PROVIDER_SITE_OTHER): Payer: 59 | Admitting: Podiatry

## 2013-04-27 VITALS — BP 166/72 | HR 64 | Resp 16 | Ht 62.0 in | Wt 170.0 lb

## 2013-04-27 DIAGNOSIS — M7660 Achilles tendinitis, unspecified leg: Secondary | ICD-10-CM

## 2013-04-27 DIAGNOSIS — M722 Plantar fascial fibromatosis: Secondary | ICD-10-CM

## 2013-04-27 MED ORDER — TRIAMCINOLONE ACETONIDE 10 MG/ML IJ SUSP
10.0000 mg | Freq: Once | INTRAMUSCULAR | Status: AC
  Administered 2013-04-27: 10 mg

## 2013-04-27 NOTE — Patient Instructions (Addendum)

## 2013-04-27 NOTE — Progress Notes (Signed)
   Subjective:    Patient ID: [name] Riley, female    DOB: March 18, 1950, 63 y.o.   MRN: [id number]  HPI Comments: "I have pain in the heel"  Patient c/o throbbing, burning pain posterior heel left since Oct. 2014. There is a knot. The area does NOT swell or get red. She does have AM pain. She has tried OTC inserts, Aleve, Mobic, open back shoes to keep pressure off-nothing helps. Pain comes and goes-doesn't matter whether she is on it or not.  Foot Pain      Review of Systems  Musculoskeletal: Positive for gait problem.  All other systems reviewed and are negative.       Objective:   Physical Exam        Assessment & Plan:

## 2013-04-28 NOTE — Progress Notes (Signed)
Subjective:     Patient ID: [name] Riley, female   DOB: 01/29/1951, 62 y.o.   MRN: [id number]  Foot Pain   patient presents stating the back of my left heel has been extremely sore for about 6 months. Does not remember specific injury but did have this in the right heel several years ago   Review of Systems  All other systems reviewed and are negative.       Objective:   Physical Exam  Nursing note and vitals reviewed. Constitutional: She is oriented to person, place, and time.  Cardiovascular: Intact distal pulses.   Musculoskeletal: Normal range of motion.  Neurological: She is oriented to person, place, and time.  Skin: Skin is warm. Pallor: insertion 3 mg dexamethasone Kenalog 5 mg Xylocaine and applied air fracture walker to completely immobilize the posterior heel. Reappoint in 3 with.   neurovascular status is intact with muscle strength within normal limits and no equinus condition noted. Inflammation and pain in the lateral portion of the Achilles tendon insertion left into the calcaneus with the central and medial band been symptom free at this time     Assessment:     Achilles tendinitis posterior left heel lateral side    Plan:     H&P and x-ray reviewed. I've recommended careful injection and reviewed possibilities for rupture associated with this and patient is willing to accept risk. I injected the lateral   finished above in objective

## 2013-05-18 ENCOUNTER — Ambulatory Visit (INDEPENDENT_AMBULATORY_CARE_PROVIDER_SITE_OTHER): Payer: 59 | Admitting: Podiatry

## 2013-05-18 ENCOUNTER — Encounter: Payer: Self-pay | Admitting: Podiatry

## 2013-05-18 VITALS — BP 142/68 | HR 60 | Resp 12

## 2013-05-18 DIAGNOSIS — M7750 Other enthesopathy of unspecified foot: Secondary | ICD-10-CM

## 2013-05-21 NOTE — Progress Notes (Signed)
Subjective:     Patient ID: [name] Riley, female   DOB: 11-12-1950, 63 y.o.   MRN: [id number]  HPI patient states my left foot is doing quite well with the pain being reduced by about 90%   Review of Systems     Objective:   Physical Exam Neurovascular status intact with continued discomfort noted upon deep palpation significant reduction inflammation left foot    Assessment:     Improved tendinitis of the left foot    Plan:     Instructed on anti-inflammatories physical therapy and supportive shoe gear usage. Reappoint her recheck as needed

## 2013-08-17 ENCOUNTER — Other Ambulatory Visit: Payer: Self-pay | Admitting: Obstetrics and Gynecology

## 2013-08-18 LAB — CYTOLOGY - PAP

## 2013-12-27 ENCOUNTER — Ambulatory Visit: Payer: 59 | Admitting: Cardiology

## 2013-12-27 ENCOUNTER — Ambulatory Visit: Payer: 59 | Admitting: Cardiovascular Disease

## 2014-02-28 DIAGNOSIS — R001 Bradycardia, unspecified: Secondary | ICD-10-CM | POA: Insufficient documentation

## 2014-02-28 DIAGNOSIS — E785 Hyperlipidemia, unspecified: Secondary | ICD-10-CM | POA: Insufficient documentation

## 2014-03-01 ENCOUNTER — Encounter: Payer: Self-pay | Admitting: Interventional Cardiology

## 2014-03-01 ENCOUNTER — Ambulatory Visit (INDEPENDENT_AMBULATORY_CARE_PROVIDER_SITE_OTHER): Payer: 59 | Admitting: Interventional Cardiology

## 2014-03-01 VITALS — BP 168/78 | HR 54 | Ht 62.0 in | Wt 164.8 lb

## 2014-03-01 DIAGNOSIS — I498 Other specified cardiac arrhythmias: Secondary | ICD-10-CM | POA: Diagnosis not present

## 2014-03-01 DIAGNOSIS — I10 Essential (primary) hypertension: Secondary | ICD-10-CM

## 2014-03-01 DIAGNOSIS — R001 Bradycardia, unspecified: Secondary | ICD-10-CM

## 2014-03-01 DIAGNOSIS — E785 Hyperlipidemia, unspecified: Secondary | ICD-10-CM

## 2014-03-01 NOTE — Progress Notes (Signed)
Patient ID: [name] Riley, female   DOB: July 19, 1950, 64 y.o.   MRN: [id number]    [address], [address], [place]  [address] Phone: [phone] Fax:  [phone]  Date:  03/01/2014   ID:  [name] Riley, DOB Mar 31, 1950, MRN [id number]  PCP:  Reginia Naas, MD   ASSESSMENT:  1.  Bradycardia , sinus , during colonoscopy 2. Morbid obesity 3. Hyperlipidemia 4. Elevated blood pressure 5. Snoring , previously mentioned to the patient by observers   PLAN: 1.  48-hour Holter monitor 2. Monitor blood pressure closely. We'll have at least 2 nurse blood pressure checks 3.  if Holter monitor does not demonstrate rate cardio less than 45 bpm, no further action will be required.  4. Consider sleep study , as sleep apnea could be causing nocturnal bradycardia.   SUBJECTIVE: [name] Riley is a 64 y.o. female  Who has a history of bradycardia. Previously evaluated with an exercise treadmill test greater than 3 years ago. No abnormalities were found. She is asymptomatic at this time. During a colonoscopy, she was noted to have bradycardia ( sinus ) with heart rates near 30 bpm. It is unclear to me if this was during anesthesia or procedural manipulation or if it occurred in recovery. She has never had syncope. She denies exertional intolerance. She does have mild exertional dyspnea. His family history of coronary artery disease. She denies orthopnea or lower extremity edema.   Wt Readings from Last 3 Encounters:  03/01/14 164 lb 12.8 oz (74.753 kg)  04/27/13 170 lb (77.111 kg)  01/12/12 168 lb 9.6 oz (76.476 kg)     Past Medical History  Diagnosis Date  . Hyperlipidemia   . Depression   . Allergy   . Insomnia   . DJD (degenerative joint disease)   . Bradycardia   . Hx of colonoscopy 12/2003  . Complication of anesthesia     "hard to wake up" and has bradycardia  . Bronchitis     hx of  . Anemia     hx of  . Chronic back pain   . Diverticulosis     Current  Outpatient Prescriptions  Medication Sig Dispense Refill  . ALPRAZolam (XANAX) 0.25 MG tablet Take 0.25 mg by mouth at bedtime as needed. For sleep    . Calcium Carbonate-Vitamin D (CALTRATE 600+D PO) Take 1 tablet by mouth daily.     . cetirizine (ZYRTEC) 10 MG tablet Take 10 mg by mouth daily.    Marland Kitchen escitalopram (LEXAPRO) 20 MG tablet Take 20 mg by mouth daily.    . fish oil-omega-3 fatty acids 1000 MG capsule Take 2 g by mouth daily.    . meloxicam (MOBIC) 15 MG tablet Take 15 mg by mouth daily as needed. For pain    . Multiple Vitamin (MULTIVITAMIN WITH MINERALS) TABS Take 1 tablet by mouth daily.    . traZODone (DESYREL) 50 MG tablet Take 50 mg by mouth at bedtime.     No current facility-administered medications for this visit.    Allergies:    Allergies  Allergen Reactions  . Codeine Nausea Only    Social History:  The patient  reports that she quit smoking about 16 years ago. She does not have any smokeless tobacco history on file. She reports that she does not drink alcohol or use illicit drugs.   ROS:  Please see the history of present illness.    Denies chest pain. Appetite is stable. No  peripheral edema.   All other systems reviewed and negative.   OBJECTIVE: VS:  BP 168/78 mmHg  Pulse 54  Ht 5\' 2"  (1.575 m)  Wt 164 lb 12.8 oz (74.753 kg)  BMI 30.13 kg/m2 Well nourished, well developed, in no acute distress,  obese HEENT: normal Neck: JVD  flat. Carotid bruit  Absent. Bull neck.  Cardiac:  normal S1, S2; RRR; no murmur Lungs:  clear to auscultation bilaterally, no wheezing, rhonchi or rales Abd: soft, nontender, no hepatomegaly Ext: Edema  absent. Pulses  2+ Skin: warm and dry Neuro:  CNs 2-12 intact, no focal abnormalities noted  EKG:   Sinus bradycardia at 52 beats a minute       Signed, Illene Labrador III, MD 03/01/2014 10:40 AM

## 2014-03-01 NOTE — Patient Instructions (Signed)
Your physician recommends that you continue on your current medications as directed. Please refer to the Current Medication list given to you today.  Your physician has recommended that you wear a holter monitor. Holter monitors are medical devices that record the heart's electrical activity. Doctors most often use these monitors to diagnose arrhythmias. Arrhythmias are problems with the speed or rhythm of the heartbeat. The monitor is a small, portable device. You can wear one while you do your normal daily activities. This is usually used to diagnose what is causing palpitations/syncope (passing out).  Your physician recommends that you schedule a follow-up appointment with nurse for a BP check  Your physician recommends that you schedule a follow-up appointment with Dr.Smith pending holter results and BP ck

## 2014-03-07 ENCOUNTER — Encounter (INDEPENDENT_AMBULATORY_CARE_PROVIDER_SITE_OTHER): Payer: 59

## 2014-03-07 ENCOUNTER — Encounter: Payer: Self-pay | Admitting: *Deleted

## 2014-03-07 DIAGNOSIS — R001 Bradycardia, unspecified: Secondary | ICD-10-CM

## 2014-03-07 NOTE — Progress Notes (Signed)
Patient ID: [name] Riley, female   DOB: Aug 19, 1950, 64 y.o.   MRN: [id number] [facility] 48 hour holter monitor applied to patient.

## 2014-03-09 ENCOUNTER — Ambulatory Visit (INDEPENDENT_AMBULATORY_CARE_PROVIDER_SITE_OTHER): Payer: Self-pay | Admitting: Pharmacist

## 2014-03-09 VITALS — BP 158/78 | Wt 166.5 lb

## 2014-03-09 DIAGNOSIS — I10 Essential (primary) hypertension: Secondary | ICD-10-CM

## 2014-03-16 ENCOUNTER — Encounter: Payer: Self-pay | Admitting: Cardiology

## 2014-03-16 ENCOUNTER — Encounter: Payer: Self-pay | Admitting: Interventional Cardiology

## 2014-03-20 ENCOUNTER — Telehealth: Payer: Self-pay

## 2014-03-20 NOTE — Telephone Encounter (Signed)
Pt aware of holter monitor results -Normal Pt verbalized understanding.

## 2014-03-26 MED ORDER — CHLORTHALIDONE 25 MG PO TABS: 25.0000 mg | ORAL_TABLET | Freq: Every day | ORAL | Status: DC

## 2014-03-26 NOTE — Progress Notes (Signed)
Pharmacist Hypertension Clinic - Resident Research Study  S/O: [name] Riley is a 64 y.o. female presenting to clinic today for her initial visit and referred to hypertension clinic by Dr. Daneen Schick.  Pt reports she has never been treated for HTN because her "BP is only high at the MD office."  She was recently seen by Dr. Tamala Julian for bradycardia and was given a 48-hour holter monitor.  Results from this are not available at today's visit.   Current HTN meds: None   FH:  Mother- HTN, CABG, DM, and stroke- died at 66  Maternal aunts and uncles- HTN and DM Daughter- gestational diabetes   SH:  Pt does not smoke.  Dietary Review:  Pt does drink about 3 cupps of coffee each day and drinks about 4 diet sodas per week.  She does not add salt to her food.  Breakfast- egg and oatmeal, Lunch- cottage cheese with canned fruit, Dinner- meat, pinto beans, corn, hamburger, etc.  She does like cookies at bread.   Exercise: Pt had to slow down her exercise regimen about 1 1/2 years ago due to plantar fascitis.  She did get a FitBit in November and reaches her 10,000 steps at least 5 of 7 days each week.   Wt Readings from Last 3 Encounters:  03/09/14 166 lb 8 oz (75.524 kg)  03/01/14 164 lb 12.8 oz (74.753 kg)  04/27/13 170 lb (77.111 kg)   BP Readings from Last 3 Encounters:  03/09/14 158/78  03/01/14 168/78  05/18/13 142/68   Pulse Readings from Last 3 Encounters:  03/01/14 54  05/18/13 60  04/27/13 64    Renal function: SCr from 06/08/13: 0.80  Outpatient Encounter Prescriptions as of 03/09/2014  Medication Sig  . ALPRAZolam (XANAX) 0.25 MG tablet Take 0.25 mg by mouth at bedtime as needed. For sleep  . Calcium Carbonate-Vitamin D (CALTRATE 600+D PO) Take 1 tablet by mouth daily.   . cetirizine (ZYRTEC) 10 MG tablet Take 10 mg by mouth daily.  Marland Kitchen escitalopram (LEXAPRO) 20 MG tablet Take 20 mg by mouth daily.  . fish oil-omega-3 fatty acids 1000 MG capsule Take 2 g by mouth daily.  .  meloxicam (MOBIC) 15 MG tablet Take 15 mg by mouth daily as needed. For pain  . Multiple Vitamin (MULTIVITAMIN WITH MINERALS) TABS Take 1 tablet by mouth daily.  . traZODone (DESYREL) 50 MG tablet Take 50 mg by mouth at bedtime.    Allergies: Allergies  Allergen Reactions  . Codeine Nausea Only    A/P: Pt has qualified for study enrollment and has signed informed consent. Pt was randomized to study group: home testing   Pt is not at BP goal of < 140/90 mmHg.  Discussed need to treat BP with patient given this is not the first time her BP has been above goal.  Will start with thiazide diuretic as patient has no compelling indications for alternative therapy.  Will start chlorthalidone 25mg  daily and recheck BMET within 1 week.

## 2014-04-05 ENCOUNTER — Other Ambulatory Visit: Payer: Self-pay | Admitting: Pharmacist

## 2014-04-05 ENCOUNTER — Other Ambulatory Visit (INDEPENDENT_AMBULATORY_CARE_PROVIDER_SITE_OTHER): Payer: 59 | Admitting: *Deleted

## 2014-04-05 DIAGNOSIS — I10 Essential (primary) hypertension: Secondary | ICD-10-CM

## 2014-04-05 LAB — BASIC METABOLIC PANEL
BUN: 16 mg/dL (ref 6–23)
CO2: 32 mEq/L (ref 19–32)
Calcium: 10 mg/dL (ref 8.4–10.5)
Chloride: 102 mEq/L (ref 96–112)
Creatinine, Ser: 0.94 mg/dL (ref 0.40–1.20)
GFR: 63.33 mL/min (ref >60.00)
Glucose, Bld: 101 mg/dL — ABNORMAL HIGH (ref 70–99)
Potassium: 3.2 mEq/L — ABNORMAL LOW (ref 3.5–5.1)
Sodium: 140 mEq/L (ref 135–145)

## 2014-04-06 ENCOUNTER — Telehealth: Payer: Self-pay | Admitting: Pharmacist

## 2014-04-06 DIAGNOSIS — I10 Essential (primary) hypertension: Secondary | ICD-10-CM

## 2014-04-06 MED ORDER — LISINOPRIL 10 MG PO TABS: 10.0000 mg | ORAL_TABLET | Freq: Every day | ORAL | Status: DC

## 2014-04-06 NOTE — Telephone Encounter (Signed)
Pharmacist Hypertension Clinic - Resident Research Study  S/O: [name] Riley is a 64 y.o. whtie female.  I'm calling her today for her 4wk telephone F/U visit, referred to hypertension clinic by Dr. Daneen Schick.  48hr holter monitor results from about a month ago were normal.  Current HTN meds: Chlorthalidone 25mg  daily (started 03/27/14)   FH:  Mother- HTN, CABG, DM, and stroke- died at 57  Maternal aunts and uncles- HTN and DM Daughter- gestational diabetes   SH:  Pt does not smoke (quit about 104yr ago), denies drinking alcohol, denies illicit drug use.  Dietary Review:  Pt does drink about 3 cups of coffee each day and drinks about 4 diet sodas per week.  She does not add salt (or other seasonings/spices) to her food - primarily uses fresh veggies and olive oil for flavoring.  Breakfast- egg and oatmeal, Lunch- cottage cheese with canned fruit, Dinner- meat, pinto beans, corn, hamburger, etc.  She does like cookies, bread, and peanuts (recently switched to unsalted).   Exercise: Pt had to slow down her exercise regimen about 1 1/2 years ago due to plantar fascitis.  She did get a FitBit in November and reaches her 10,000 steps at least 5 of 7 days each week.  The cold weather has been limiting her exercise, but she still reports trying to remain fit and increasing her exercise.  Wt Readings from Last 3 Encounters:  03/09/14 166 lb 8 oz (75.524 kg)  03/01/14 164 lb 12.8 oz (74.753 kg)  04/27/13 170 lb (77.111 kg)   BP Readings from Last 3 Encounters:  04/06/14 153/84  03/09/14 158/78  03/01/14 168/78   Pulse Readings from Last 3 Encounters:  03/01/14 54  05/18/13 60  04/27/13 64    Renal function: CrCl ~57 mL/min, K+ dropped from 4.7 >> 3.2 after addition of chlorthalidone  Outpatient Encounter Prescriptions as of 04/06/2014  Medication Sig  . lisinopril (PRINIVIL,ZESTRIL) 10 MG tablet Take 10 mg by mouth daily.  Marland Kitchen ALPRAZolam (XANAX) 0.25 MG tablet Take 0.25 mg by mouth at  bedtime as needed. For sleep  . Calcium Carbonate-Vitamin D (CALTRATE 600+D PO) Take 1 tablet by mouth daily.   . cetirizine (ZYRTEC) 10 MG tablet Take 10 mg by mouth daily.  . chlorthalidone (HYGROTON) 25 MG tablet Take 1 tablet (25 mg total) by mouth daily.  Marland Kitchen escitalopram (LEXAPRO) 20 MG tablet Take 20 mg by mouth daily.  . fish oil-omega-3 fatty acids 1000 MG capsule Take 2 g by mouth daily.  . meloxicam (MOBIC) 15 MG tablet Take 15 mg by mouth daily as needed. For pain  . Multiple Vitamin (MULTIVITAMIN WITH MINERALS) TABS Take 1 tablet by mouth daily.  . traZODone (DESYREL) 50 MG tablet Take 50 mg by mouth at bedtime.    Allergies: Allergies  Allergen Reactions  . Codeine Nausea Only    A/P: Pt has qualified for study enrollment and has signed informed consent. Pt was randomized to study group: Home BP monitoring and telephone F/U  After reviewing her home BP log, her home BP seems to have improved some with addition of chlorthalidone but she still is not at BP goal of < 140/90 mmHg.  Her potassium has dropped dramatically with addition of chlorthalidone.  Due to need for additional BP lowering and based on potassium, will add lisinopril 10mg  daily.  Recheck BMET in 2 weeks.  Follow-up telephone call in 4 weeks.  Drucie Opitz, PharmD Clinical Pharmacy Resident

## 2014-04-20 ENCOUNTER — Other Ambulatory Visit (INDEPENDENT_AMBULATORY_CARE_PROVIDER_SITE_OTHER): Payer: 59 | Admitting: *Deleted

## 2014-04-20 DIAGNOSIS — I10 Essential (primary) hypertension: Secondary | ICD-10-CM

## 2014-04-20 LAB — BASIC METABOLIC PANEL
BUN: 19 mg/dL (ref 6–23)
CO2: 34 mEq/L — ABNORMAL HIGH (ref 19–32)
Calcium: 10.2 mg/dL (ref 8.4–10.5)
Chloride: 101 mEq/L (ref 96–112)
Creatinine, Ser: 1.02 mg/dL (ref 0.40–1.20)
GFR: 57.63 mL/min — ABNORMAL LOW (ref >60.00)
Glucose, Bld: 101 mg/dL — ABNORMAL HIGH (ref 70–99)
Potassium: 3.7 mEq/L (ref 3.5–5.1)
Sodium: 139 mEq/L (ref 135–145)

## 2014-04-20 NOTE — Addendum Note (Signed)
Addended by: Eulis Foster on: 04/20/2014 07:55 AM   Modules accepted: Orders

## 2014-04-24 ENCOUNTER — Telehealth: Payer: Self-pay

## 2014-04-24 NOTE — Telephone Encounter (Signed)
called to give pt lab results.lmtcb

## 2014-04-24 NOTE — Telephone Encounter (Signed)
-----   Message from Belva Crome, MD sent at 04/21/2014  4:04 PM EST ----- Potassium is okay. Plan to continue current therapy.  Is she on potassium tablet or increasing natural sources?

## 2014-05-04 ENCOUNTER — Telehealth: Payer: Self-pay | Admitting: Pharmacist

## 2014-05-04 DIAGNOSIS — I10 Essential (primary) hypertension: Secondary | ICD-10-CM

## 2014-05-04 NOTE — Telephone Encounter (Signed)
Pharmacist Hypertension Clinic - Resident Research Study  S/O: [name] Riley is a 64 y.o. whtie female.  I'm calling her today for her 8wk telephone F/U visit, referred to hypertension clinic by Dr. Daneen Schick.  Pt states that she was very tired/fatigued for about a week after starting chlorthalidone, but that has since resolved.  Pt reports that her PCP started her on Crestor 04/22/2014.  She was instructed to have her lipid panel and LFTs checked.  She was advised to have them done either here at [facility] or w/ PCP.  Current HTN meds: Chlorthalidone 25mg  daily Lisinopril 10mg  daily (started 04/06/2014)  FH:  Mother- HTN, CABG, DM, and stroke- died at 37  Maternal aunts and uncles- HTN and DM Daughter- gestational diabetes   SH:  Pt does not smoke (quit about 32yr ago), denies drinking alcohol, denies illicit drug use.  Dietary Review:  Pt does drink about 3 cups of coffee each day and drinks about 4 diet sodas per week.  She does not add salt (or other seasonings/spices) to her food - primarily uses fresh veggies and olive oil for flavoring.  Breakfast- egg and oatmeal, Lunch- cottage cheese with canned fruit, Dinner- meat, pinto beans, corn, hamburger, etc.  She does like cookies, bread, and peanuts (unsalted). No changes since last visit.  Exercise: Pt had to slow down her exercise regimen about 1 1/2 years ago due to plantar fascitis.  She did get a FitBit in November and reaches her 10,000 steps at least 5 of 7 days each week.  Wt Readings from Last 3 Encounters:  03/09/14 166 lb 8 oz (75.524 kg)  03/01/14 164 lb 12.8 oz (74.753 kg)  04/27/13 170 lb (77.111 kg)   BP Readings from Last 3 Encounters:  05/04/14 137/72  04/06/14 153/84  03/09/14 158/78   Pulse Readings from Last 3 Encounters:  03/01/14 54  05/18/13 60  04/27/13 64    Renal function: CrCl ~55 mL/min, K+ dropped from 4.7 >> 3.2 after addition of chlorthalidone >> now 3.7 w/ addition of lisinopril (labs as of  04/20/2014)  Outpatient Encounter Prescriptions as of 05/04/2014  Medication Sig  . ALPRAZolam (XANAX) 0.25 MG tablet Take 0.25 mg by mouth at bedtime as needed. For sleep  . Calcium Carbonate-Vitamin D (CALTRATE 600+D PO) Take 1 tablet by mouth daily.   . cetirizine (ZYRTEC) 10 MG tablet Take 10 mg by mouth daily.  . chlorthalidone (HYGROTON) 25 MG tablet Take 1 tablet (25 mg total) by mouth daily.  Marland Kitchen escitalopram (LEXAPRO) 20 MG tablet Take 20 mg by mouth daily.  . fish oil-omega-3 fatty acids 1000 MG capsule Take 2 g by mouth daily.  Marland Kitchen lisinopril (PRINIVIL,ZESTRIL) 10 MG tablet Take 1 tablet (10 mg total) by mouth daily.  . meloxicam (MOBIC) 15 MG tablet Take 15 mg by mouth daily as needed. For pain  . Multiple Vitamin (MULTIVITAMIN WITH MINERALS) TABS Take 1 tablet by mouth daily.  . traZODone (DESYREL) 50 MG tablet Take 50 mg by mouth at bedtime.    Allergies: Allergies  Allergen Reactions  . Codeine Nausea Only    A/P: Pt has qualified for study enrollment and has signed informed consent. Pt was randomized to study group: Home BP monitoring and telephone F/U  After reviewing her home BP log, pt is at BP goal of < 140/90 mmHg.  Her potassium dropped dramatically with addition of chlorthalidone, but is now WNL w/ addition of lisinopril.  Continue all meds as prescribed Continue monitoring  BP and bring BP log to next visit Check BMET in 4 weeks Also check FLP and LFTs at pt's request (6wk after starting Crestor, will send to PCP) F/U for final study visit in clinic in 4 weeks  Drucie Opitz, PharmD Clinical Pharmacy Resident

## 2014-06-04 ENCOUNTER — Other Ambulatory Visit (INDEPENDENT_AMBULATORY_CARE_PROVIDER_SITE_OTHER): Payer: 59 | Admitting: *Deleted

## 2014-06-04 DIAGNOSIS — I10 Essential (primary) hypertension: Secondary | ICD-10-CM | POA: Diagnosis not present

## 2014-06-04 LAB — BASIC METABOLIC PANEL
BUN: 14 mg/dL (ref 6–23)
CO2: 30 mEq/L (ref 19–32)
Calcium: 9.6 mg/dL (ref 8.4–10.5)
Chloride: 101 mEq/L (ref 96–112)
Creatinine, Ser: 0.85 mg/dL (ref 0.40–1.20)
GFR: 71.09 mL/min (ref >60.00)
Glucose, Bld: 105 mg/dL — ABNORMAL HIGH (ref 70–99)
Potassium: 3.4 mEq/L — ABNORMAL LOW (ref 3.5–5.1)
Sodium: 137 mEq/L (ref 135–145)

## 2014-06-04 LAB — HEPATIC FUNCTION PANEL
ALT: 57 U/L — ABNORMAL HIGH (ref 0–35)
AST: 35 U/L (ref 0–37)
Albumin: 4.3 g/dL (ref 3.5–5.2)
Alkaline Phosphatase: 53 U/L (ref 39–117)
Bilirubin, Direct: 0.2 mg/dL (ref 0.0–0.3)
Total Bilirubin: 1 mg/dL (ref 0.2–1.2)
Total Protein: 7 g/dL (ref 6.0–8.3)

## 2014-06-04 LAB — LIPID PANEL
Cholesterol: 147 mg/dL (ref 0–200)
HDL: 39.4 mg/dL (ref >39.00)
LDL Cholesterol: 92 mg/dL (ref 0–99)
NonHDL: 107.6
Total CHOL/HDL Ratio: 4
Triglycerides: 77 mg/dL (ref 0.0–149.0)
VLDL: 15.4 mg/dL (ref 0.0–40.0)

## 2014-06-05 ENCOUNTER — Encounter: Payer: Self-pay | Admitting: Pharmacist

## 2014-06-05 ENCOUNTER — Ambulatory Visit (INDEPENDENT_AMBULATORY_CARE_PROVIDER_SITE_OTHER): Payer: 59 | Admitting: Pharmacist Clinician (PhC)/ Clinical Pharmacy Specialist

## 2014-06-05 VITALS — BP 130/64 | HR 56 | Wt 160.5 lb

## 2014-06-05 DIAGNOSIS — I10 Essential (primary) hypertension: Secondary | ICD-10-CM

## 2014-06-05 MED ORDER — LISINOPRIL 20 MG PO TABS: 20.0000 mg | ORAL_TABLET | Freq: Every day | ORAL | Status: DC

## 2014-06-05 NOTE — Patient Instructions (Signed)
It has been a pleasure working with you these past few months  Stop taking chlorthalidone Increase lisinopril to 20mg  each night at bedtime Continue checking your BP at home If BP remains consistently below 140/90, check labs in 3-4 weeks    *Currently scheduled for 07/02/14 - come anytime after 7:30am If BP remains consistently above 140/90, call our office so we can increase lisinopril Call us if we need to reschedule your lab appointment and increase your lisinopril [phone]

## 2014-06-05 NOTE — Progress Notes (Signed)
Pharmacist Hypertension Clinic - Resident Research Study  S/O: [name] Riley is a 64 y.o. white female presenting today for final F/U visit, referred to hypertension clinic by Dr. Daneen Schick.  Current HTN meds: Chlorthalidone 25mg  daily Lisinopril 10mg  daily  FH:  Mother- HTN, CABG, DM, and stroke- died at 18  Maternal aunts and uncles- HTN and DM Daughter- gestational diabetes   SH:  Pt does not smoke (quit about 48yr ago), denies drinking alcohol, denies illicit drug use.  Dietary Review:  Pt does drink about 3 cups of coffee each day and drinks about 4 diet sodas per week.  She does not add salt (or other seasonings/spices) to her food - primarily uses fresh veggies and olive oil for flavoring.  Breakfast- egg and oatmeal, Lunch- cottage cheese with canned fruit, Dinner- meat, pinto beans, corn, hamburger, etc.  She does like cookies, bread, and peanuts (unsalted). No changes since last visit.  Exercise: Pt had to slow down her exercise regimen about 1 1/2 years ago due to plantar fascitis.  She did get a FitBit in November and reaches her 10,000 steps at least 5 of 7 days each week.  She reports starting tumeric supplement in March, which has improved her plantar fascitis.  Wt Readings from Last 3 Encounters:  06/05/14 160 lb 8 oz (72.802 kg)  03/09/14 166 lb 8 oz (75.524 kg)  03/01/14 164 lb 12.8 oz (74.753 kg)   BP Readings from Last 3 Encounters:  06/05/14 130/64  05/04/14 137/72  04/06/14 153/84   Pulse Readings from Last 3 Encounters:  06/05/14 56  03/01/14 54  05/18/13 60    Renal function: CrCl ~60 mL/min, K+ dropped from 4.7 >> 3.4 with addition of chlorthalidone and low-dose lisinopril  Outpatient Encounter Prescriptions as of 06/05/2014  Medication Sig  . ALPRAZolam (XANAX) 0.25 MG tablet Take 0.25 mg by mouth at bedtime as needed. For sleep  . Calcium Carbonate-Vitamin D (CALTRATE 600+D PO) Take 1 tablet by mouth daily.   . cetirizine (ZYRTEC) 10 MG tablet  Take 10 mg by mouth daily.  Marland Kitchen escitalopram (LEXAPRO) 20 MG tablet Take 20 mg by mouth daily.  . fish oil-omega-3 fatty acids 1000 MG capsule Take 1 g by mouth 2 (two) times daily.   Marland Kitchen lisinopril (PRINIVIL,ZESTRIL) 20 MG tablet Take 1 tablet (20 mg total) by mouth daily.  . meloxicam (MOBIC) 15 MG tablet Take 15 mg by mouth daily as needed. For pain  . Multiple Vitamin (MULTIVITAMIN WITH MINERALS) TABS Take 1 tablet by mouth daily.  . traZODone (DESYREL) 50 MG tablet Take 50 mg by mouth at bedtime.  . [DISCONTINUED] chlorthalidone (HYGROTON) 25 MG tablet Take 1 tablet (25 mg total) by mouth daily.  . [DISCONTINUED] lisinopril (PRINIVIL,ZESTRIL) 10 MG tablet Take 1 tablet (10 mg total) by mouth daily.    Allergies: Allergies  Allergen Reactions  . Codeine Nausea Only    A/P: Pt has qualified for study enrollment and has signed informed consent. Pt was randomized to study group: Home BP monitoring and telephone F/U  Pt is at BP goal of < 140/90 mmHg. Pt's home BP log also indicates that she is consistently at BP goal.  Her potassium remains low after adding chlorthalidone even with the addition of lisinopril.  Will stop her chlorthalidone and optimize dose of lisinopril for BP control and attempt to correct her potassium.  Pt also expresses concern that diabetes runs in her family - advised that taking lisinopril at bedtime may help  prevent development of T2DM and provide better prevention of CV events.  Provided pt with results from her LFTs and FLP to provide to her PCP since she was started Crestor.  I also discussed her lab results and answered her medication-related questions.  Stop taking chlorthalidone Increase lisinopril to 20mg  daily Continue checking BP at home and logging results If BP remains consistently at goal (below 140/90), check BMET in 3-4 weeks If BP remains consistently above 140/90, call us to reschedule BMET and we will increase lisinopril to 40mg  daily.  In 3-4  weeks, will assess BP control, need to further titrate lisinopril, and when to check a BMET.  Drucie Opitz, PharmD Clinical Pharmacy Resident

## 2014-06-07 ENCOUNTER — Telehealth: Payer: Self-pay

## 2014-06-07 DIAGNOSIS — E785 Hyperlipidemia, unspecified: Secondary | ICD-10-CM

## 2014-06-07 NOTE — Telephone Encounter (Signed)
-----   Message from Belva Crome, MD sent at 06/04/2014  5:51 PM EDT ----- Labs are okay/ near target

## 2014-06-07 NOTE — Telephone Encounter (Signed)
called to give pt lab results.lmtcb

## 2014-06-26 NOTE — Telephone Encounter (Signed)
-----   Message from Belva Crome, MD sent at 06/04/2014  5:51 PM EDT ----- Labs are okay/ near target

## 2014-06-26 NOTE — Telephone Encounter (Signed)
Pt aware of lab results with verbal understanding. Labs are okay/ near target

## 2014-07-02 ENCOUNTER — Other Ambulatory Visit (INDEPENDENT_AMBULATORY_CARE_PROVIDER_SITE_OTHER): Payer: 59 | Admitting: *Deleted

## 2014-07-02 DIAGNOSIS — I10 Essential (primary) hypertension: Secondary | ICD-10-CM | POA: Diagnosis not present

## 2014-07-02 LAB — BASIC METABOLIC PANEL
BUN: 11 mg/dL (ref 6–23)
CO2: 26 mEq/L (ref 19–32)
Calcium: 9.3 mg/dL (ref 8.4–10.5)
Chloride: 108 mEq/L (ref 96–112)
Creatinine, Ser: 0.87 mg/dL (ref 0.40–1.20)
GFR: 69.19 mL/min (ref >60.00)
Glucose, Bld: 163 mg/dL — ABNORMAL HIGH (ref 70–99)
Potassium: 3.6 mEq/L (ref 3.5–5.1)
Sodium: 142 mEq/L (ref 135–145)

## 2014-07-04 ENCOUNTER — Telehealth: Payer: Self-pay

## 2014-07-04 NOTE — Telephone Encounter (Signed)
-----   Message from Belva Crome, MD sent at 07/02/2014  7:04 PM EDT ----- Potassium now low normal.

## 2014-07-04 NOTE — Telephone Encounter (Signed)
Pt aware of lab results. Potassium now low normal. Pt verbalized understanding.

## 2014-07-10 ENCOUNTER — Other Ambulatory Visit: Payer: Self-pay | Admitting: Physician Assistant

## 2015-01-14 ENCOUNTER — Other Ambulatory Visit: Payer: Self-pay | Admitting: Family Medicine

## 2015-01-14 DIAGNOSIS — R7989 Other specified abnormal findings of blood chemistry: Secondary | ICD-10-CM

## 2015-01-14 DIAGNOSIS — R945 Abnormal results of liver function studies: Principal | ICD-10-CM

## 2015-01-21 ENCOUNTER — Other Ambulatory Visit: Payer: 59

## 2015-01-22 ENCOUNTER — Other Ambulatory Visit: Payer: 59

## 2015-01-24 ENCOUNTER — Ambulatory Visit
Admission: RE | Admit: 2015-01-24 | Discharge: 2015-01-24 | Disposition: A | Discharge status: Home / Self Care | Payer: 59 | Source: Ambulatory Visit | Attending: Family Medicine | Admitting: Family Medicine

## 2015-01-24 DIAGNOSIS — R945 Abnormal results of liver function studies: Principal | ICD-10-CM

## 2015-01-24 DIAGNOSIS — R7989 Other specified abnormal findings of blood chemistry: Secondary | ICD-10-CM

## 2015-11-01 ENCOUNTER — Ambulatory Visit: Payer: Self-pay | Admitting: General Surgery

## 2015-11-01 NOTE — H&P (Signed)
[name] Riley 11/01/2015 3:48 PM Location: [facility] Patient #: [id number] DOB: 1950-11-12 Divorced / Language: [name] / Race: White Female  History of Present Illness [name] Hollingshead MD; 11/01/2015 4:43 PM) The patient is a 65 year old female.   Note:She presents for follow-up visit because she has developed symptomatic cholelithiasis. I saw her back in April this year. At that time she had asymptomatic cholelithiasis. 11 days ago, she developed a symptomatic episode with pressure type epigastric pain radiating through to the back associated with nausea and vomiting. Sometimes she wakes up at night and some nausea and vomiting and pain. No fever or chills. She saw Dr. Tamala Julian. No elevation of white blood cell count. Liver function tests were normal.  Allergies (April Staton, CMA; 11/01/2015 3:50 PM) Codeine Phosphate *ANALGESICS - OPIOID*  Medication History (April Staton, CMA; 11/01/2015 3:58 PM) TraZODone HCl (50MG  Tablet, Oral) Active. ALPRAZolam (0.25MG  Tablet, Oral) Active. Lisinopril (10MG  Tablet, Oral) Active. Lexapro (20MG  Tablet, Oral) Active. Lipitor (20MG  Tablet, Oral) Active. Crestor (5MG  Tablet, Oral) Active. Turmeric (500MG  Tablet, Oral) Active. Centrum Adults (Oral) Active. Caltrate 600 Plus-Vit D (600-200MG -UNIT Tablet, Oral) Active. Fish Oil (Oral) Specific dose unknown - Active. Medications Reconciled    Vitals (April Staton CMA; 11/01/2015 4:06 PM) 11/01/2015 3:58 PM Weight: 153.13 lb Height: 61in Height was reported by patient. Body Surface Area: 1.69 m Body Mass Index: 28.93 kg/m  Temp.: 98.64F(Oral)  Pulse: 56 (Regular)  P.OX: 95% (Room air) BP: 140/62 (Sitting, Left Arm, Standard)      Physical Exam [name] Hollingshead MD; 11/01/2015 4:39 PM)  The physical exam findings are as follows: Note:General: WDWN in NAD. Pleasant and cooperative.  EYES: EOMI, no scleral icterus  CV: RRR, no murmur, no  edema  ABDOMEN: Soft, very mild epigastric tenderness, nondistended, no masses, no organomegaly, active bowel sounds, periumbilical scar  SKIN: No jaundice.  NEUROLOGIC: Alert and oriented, answers questions appropriately.  PSYCHIATRIC: Normal mood, affect , and behavior.    Assessment & Plan [name] Hollingshead MD; 11/01/2015 4:40 PM)  SYMPTOMATIC CHOLELITHIASIS (K80.20) Impression: She has developed this over the past 10-11 days.  Plan: Schedule laparoscopic possible open cholecystectomy with cholangiogram. I have explained the procedure, risks, and aftercare of cholecystectomy. Risks include but are not limited to bleeding, infection, wound problems, anesthesia, diarrhea, bile leak, injury to common bile duct/liver/intestine. She seems to understand and agrees to proceed.  Jackolyn Confer, M.D.

## 2015-11-06 ENCOUNTER — Encounter (HOSPITAL_COMMUNITY): Payer: Self-pay | Admitting: *Deleted

## 2015-11-06 NOTE — Anesthesia Preprocedure Evaluation (Addendum)
Anesthesia Evaluation  Patient identified by MRN, date of birth, ID band Patient awake    Reviewed: Allergy & Precautions, NPO status , Patient's Chart, lab work & pertinent test results  History of Anesthesia Complications (+) PONV and history of anesthetic complications  Airway Mallampati: II  TM Distance: >3 FB Neck ROM: Full   Comment: Previous grade II view with MAC 3 Dental  (+) Partial Upper,    Pulmonary neg shortness of breath, neg sleep apnea, neg COPD, neg recent URI, former smoker,    Pulmonary exam normal breath sounds clear to auscultation       Cardiovascular Exercise Tolerance: Good hypertension, Pt. on medications (-) angina(-) Past MI, (-) Cardiac Stents and (-) Orthopnea + dysrhythmias (SB with PVCs)  Rhythm:Regular Rate:Normal  HLD   Neuro/Psych PSYCHIATRIC DISORDERS Anxiety Depression negative neurological ROS     GI/Hepatic Neg liver ROS, neg GERD  ,Diverticulosis, symptomatic cholelithiasis   Endo/Other  negative endocrine ROS  Renal/GU negative Renal ROS     Musculoskeletal  (+) Arthritis , Chronic back pain   Abdominal   Peds  Hematology negative hematology ROS (+)   Anesthesia Other Findings   Reproductive/Obstetrics                            Anesthesia Physical Anesthesia Plan  ASA: III  Anesthesia Plan: General   Post-op Pain Management:    Induction: Intravenous  Airway Management Planned: Oral ETT  Additional Equipment:   Intra-op Plan:   Post-operative Plan: Extubation in OR  Informed Consent: I have reviewed the patients History and Physical, chart, labs and discussed the procedure including the risks, benefits and alternatives for the proposed anesthesia with the patient or authorized representative who has indicated his/her understanding and acceptance.   Dental advisory given  Plan Discussed with: CRNA  Anesthesia Plan Comments: (Risks  of general anesthesia discussed including, but not limited to, sore throat, hoarse voice, chipped/damaged teeth, injury to vocal cords, nausea and vomiting, allergic reactions, lung infection, heart attack, stroke, and death. All questions answered.  (labs in hard chart))       Anesthesia Quick Evaluation

## 2015-11-06 NOTE — Progress Notes (Signed)
Pt denies SOB and chest pain. Pt is under the care of Dr. Daneen Schick , Cardiology. Pt denies having an echo and cardiac cath but stated that a stress test was performed > 5 years ago that was " okay." Pt denies having a chest x ray and EKG within the last year. Pt stated that labs were drawn at PCP Dr. Carol Ada at [facility] on Thursday, 10/31/15; records requested. Pt made aware to stop taking Aspirin, vitamins, fish oil and herbal medications. Do not take any NSAIDs ie: Ibuprofen, Advil, Naproxen, BC and Goody powder or any medication containing Aspirin. Pt chart forwarded to anesthesia for review of cardiac history ( bradycardia) . Pt verbalized understanding of all pre-op instructions.

## 2015-11-07 ENCOUNTER — Ambulatory Visit (HOSPITAL_COMMUNITY): Payer: 59 | Admitting: Anesthesiology

## 2015-11-07 ENCOUNTER — Encounter (HOSPITAL_COMMUNITY)
Admission: RE | Disposition: A | Discharge status: Home / Self Care | Payer: Self-pay | Source: Ambulatory Visit | Attending: General Surgery

## 2015-11-07 ENCOUNTER — Ambulatory Visit (HOSPITAL_COMMUNITY)
Admission: RE | Admit: 2015-11-07 | Discharge: 2015-11-08 | Disposition: A | Discharge status: Home / Self Care | Payer: 59 | Source: Ambulatory Visit | Attending: General Surgery | Admitting: General Surgery

## 2015-11-07 ENCOUNTER — Ambulatory Visit (HOSPITAL_COMMUNITY): Payer: 59

## 2015-11-07 ENCOUNTER — Encounter (HOSPITAL_COMMUNITY): Payer: Self-pay | Admitting: *Deleted

## 2015-11-07 DIAGNOSIS — M1990 Unspecified osteoarthritis, unspecified site: Secondary | ICD-10-CM | POA: Insufficient documentation

## 2015-11-07 DIAGNOSIS — M549 Dorsalgia, unspecified: Secondary | ICD-10-CM | POA: Diagnosis not present

## 2015-11-07 DIAGNOSIS — Z419 Encounter for procedure for purposes other than remedying health state, unspecified: Secondary | ICD-10-CM

## 2015-11-07 DIAGNOSIS — I10 Essential (primary) hypertension: Secondary | ICD-10-CM | POA: Diagnosis not present

## 2015-11-07 DIAGNOSIS — G8929 Other chronic pain: Secondary | ICD-10-CM | POA: Insufficient documentation

## 2015-11-07 DIAGNOSIS — K219 Gastro-esophageal reflux disease without esophagitis: Secondary | ICD-10-CM | POA: Diagnosis not present

## 2015-11-07 DIAGNOSIS — K8010 Calculus of gallbladder with chronic cholecystitis without obstruction: Secondary | ICD-10-CM | POA: Diagnosis not present

## 2015-11-07 DIAGNOSIS — Z87891 Personal history of nicotine dependence: Secondary | ICD-10-CM | POA: Diagnosis not present

## 2015-11-07 DIAGNOSIS — K8020 Calculus of gallbladder without cholecystitis without obstruction: Secondary | ICD-10-CM | POA: Diagnosis present

## 2015-11-07 HISTORY — DX: Other specified postprocedural states: Z98.890

## 2015-11-07 HISTORY — DX: Other specified postprocedural states: R11.2

## 2015-11-07 HISTORY — DX: Calculus of gallbladder without cholecystitis without obstruction: K80.20

## 2015-11-07 HISTORY — PX: CHOLECYSTECTOMY: SHX55

## 2015-11-07 HISTORY — DX: Essential (primary) hypertension: I10

## 2015-11-07 HISTORY — DX: Anxiety disorder, unspecified: F41.9

## 2015-11-07 SURGERY — LAPAROSCOPIC CHOLECYSTECTOMY WITH INTRAOPERATIVE CHOLANGIOGRAM
Anesthesia: General | Site: Abdomen

## 2015-11-07 MED ORDER — KCL IN DEXTROSE-NACL 20-5-0.9 MEQ/L-%-% IV SOLN
INTRAVENOUS | Status: DC
  Administered 2015-11-07 – 2015-11-08 (×2): via INTRAVENOUS
  Filled 2015-11-07 (×2): qty 1000

## 2015-11-07 MED ORDER — SODIUM CHLORIDE 0.9 % IR SOLN
Status: DC | PRN
  Administered 2015-11-07: 1000 mL

## 2015-11-07 MED ORDER — KETAMINE HCL-SODIUM CHLORIDE 100-0.9 MG/10ML-% IV SOSY
PREFILLED_SYRINGE | INTRAVENOUS | Status: AC
  Filled 2015-11-07: qty 10

## 2015-11-07 MED ORDER — KETAMINE HCL 10 MG/ML IJ SOLN
INTRAMUSCULAR | Status: DC | PRN
  Administered 2015-11-07 (×2): 20 mg via INTRAVENOUS

## 2015-11-07 MED ORDER — GLYCOPYRROLATE 0.2 MG/ML IV SOSY
PREFILLED_SYRINGE | INTRAVENOUS | Status: AC
  Filled 2015-11-07: qty 3

## 2015-11-07 MED ORDER — PROMETHAZINE HCL 25 MG/ML IJ SOLN
6.2500 mg | INTRAMUSCULAR | Status: DC | PRN
Start: 2015-11-07 — End: 2015-11-07
  Administered 2015-11-07: 6.25 mg via INTRAVENOUS

## 2015-11-07 MED ORDER — MORPHINE SULFATE (PF) 4 MG/ML IV SOLN
INTRAVENOUS | Status: AC
  Administered 2015-11-07: 4 mg
  Filled 2015-11-07: qty 1

## 2015-11-07 MED ORDER — ONDANSETRON HCL 4 MG/2ML IJ SOLN
INTRAMUSCULAR | Status: AC
  Filled 2015-11-07: qty 2

## 2015-11-07 MED ORDER — ACETAMINOPHEN 325 MG PO TABS
ORAL_TABLET | ORAL | Status: AC
  Filled 2015-11-07: qty 2

## 2015-11-07 MED ORDER — ONDANSETRON HCL 4 MG/2ML IJ SOLN
INTRAMUSCULAR | Status: DC | PRN
  Administered 2015-11-07: 4 mg via INTRAVENOUS

## 2015-11-07 MED ORDER — CEFAZOLIN SODIUM-DEXTROSE 2-4 GM/100ML-% IV SOLN
2.0000 g | INTRAVENOUS | Status: AC
  Administered 2015-11-07: 2 g via INTRAVENOUS

## 2015-11-07 MED ORDER — ROCURONIUM BROMIDE 10 MG/ML (PF) SYRINGE
PREFILLED_SYRINGE | INTRAVENOUS | Status: AC
  Filled 2015-11-07: qty 10

## 2015-11-07 MED ORDER — ONDANSETRON HCL 4 MG/2ML IJ SOLN
4.0000 mg | INTRAMUSCULAR | Status: DC | PRN
  Administered 2015-11-07: 4 mg via INTRAVENOUS

## 2015-11-07 MED ORDER — PHENYLEPHRINE 40 MCG/ML (10ML) SYRINGE FOR IV PUSH (FOR BLOOD PRESSURE SUPPORT)
PREFILLED_SYRINGE | INTRAVENOUS | Status: AC
  Filled 2015-11-07: qty 10

## 2015-11-07 MED ORDER — BUPIVACAINE-EPINEPHRINE 0.25% -1:200000 IJ SOLN
INTRAMUSCULAR | Status: DC | PRN
  Administered 2015-11-07: 5 mL

## 2015-11-07 MED ORDER — CEFAZOLIN SODIUM-DEXTROSE 2-4 GM/100ML-% IV SOLN
INTRAVENOUS | Status: AC
  Filled 2015-11-07: qty 100

## 2015-11-07 MED ORDER — LIDOCAINE 2% (20 MG/ML) 5 ML SYRINGE
INTRAMUSCULAR | Status: AC
  Filled 2015-11-07: qty 5

## 2015-11-07 MED ORDER — ALPRAZOLAM 0.25 MG PO TABS
0.2500 mg | ORAL_TABLET | Freq: Every day | ORAL | Status: DC
  Administered 2015-11-07: 0.25 mg via ORAL
  Filled 2015-11-07: qty 1

## 2015-11-07 MED ORDER — FENTANYL CITRATE (PF) 100 MCG/2ML IJ SOLN
INTRAMUSCULAR | Status: AC
  Filled 2015-11-07: qty 4

## 2015-11-07 MED ORDER — SODIUM CHLORIDE 0.9 % IV SOLN
INTRAVENOUS | Status: DC | PRN
  Administered 2015-11-07: 5 mL

## 2015-11-07 MED ORDER — DEXAMETHASONE SODIUM PHOSPHATE 10 MG/ML IJ SOLN
INTRAMUSCULAR | Status: DC | PRN
  Administered 2015-11-07: 10 mg via INTRAVENOUS

## 2015-11-07 MED ORDER — SODIUM CHLORIDE 0.9 % IV SOLN: 250.0000 mL | INTRAVENOUS | Status: DC | PRN

## 2015-11-07 MED ORDER — LISINOPRIL 20 MG PO TABS
20.0000 mg | ORAL_TABLET | Freq: Every day | ORAL | Status: DC
  Administered 2015-11-08: 20 mg via ORAL
  Filled 2015-11-07: qty 1

## 2015-11-07 MED ORDER — DEXTROSE IN LACTATED RINGERS 5 % IV SOLN: INTRAVENOUS | Status: DC

## 2015-11-07 MED ORDER — OXYCODONE HCL 5 MG PO TABS
5.0000 mg | ORAL_TABLET | ORAL | Status: DC | PRN
  Administered 2015-11-07: 10 mg via ORAL

## 2015-11-07 MED ORDER — CHLORHEXIDINE GLUCONATE CLOTH 2 % EX PADS: 6.0000 | MEDICATED_PAD | Freq: Once | CUTANEOUS | Status: DC

## 2015-11-07 MED ORDER — PROPOFOL 10 MG/ML IV BOLUS
INTRAVENOUS | Status: DC | PRN
  Administered 2015-11-07: 200 mg via INTRAVENOUS

## 2015-11-07 MED ORDER — TRAZODONE HCL 50 MG PO TABS
50.0000 mg | ORAL_TABLET | Freq: Every day | ORAL | Status: DC
  Administered 2015-11-07: 50 mg via ORAL
  Filled 2015-11-07: qty 1

## 2015-11-07 MED ORDER — ONDANSETRON 4 MG PO TBDP
4.0000 mg | ORAL_TABLET | Freq: Four times a day (QID) | ORAL | Status: DC | PRN
Start: 2015-11-07 — End: 2015-11-08

## 2015-11-07 MED ORDER — ONDANSETRON HCL 4 MG PO TABS: 4.0000 mg | ORAL_TABLET | ORAL | 0 refills | Status: DC | PRN

## 2015-11-07 MED ORDER — ARTIFICIAL TEARS OP OINT
TOPICAL_OINTMENT | OPHTHALMIC | Status: DC | PRN
  Administered 2015-11-07: 1 via OPHTHALMIC

## 2015-11-07 MED ORDER — OXYCODONE HCL 5 MG PO TABS: 5.0000 mg | ORAL_TABLET | ORAL | 0 refills | Status: DC | PRN

## 2015-11-07 MED ORDER — OXYCODONE HCL 5 MG PO TABS
ORAL_TABLET | ORAL | Status: AC
  Administered 2015-11-07: 10 mg via ORAL
  Filled 2015-11-07: qty 2

## 2015-11-07 MED ORDER — MIDAZOLAM HCL 2 MG/2ML IJ SOLN
INTRAMUSCULAR | Status: AC
  Filled 2015-11-07: qty 2

## 2015-11-07 MED ORDER — 0.9 % SODIUM CHLORIDE (POUR BTL) OPTIME
TOPICAL | Status: DC | PRN
  Administered 2015-11-07: 1000 mL

## 2015-11-07 MED ORDER — SODIUM CHLORIDE 0.9% FLUSH: 3.0000 mL | INTRAVENOUS | Status: DC | PRN

## 2015-11-07 MED ORDER — ESCITALOPRAM OXALATE 20 MG PO TABS
20.0000 mg | ORAL_TABLET | Freq: Every day | ORAL | Status: DC
  Administered 2015-11-08: 20 mg via ORAL
  Filled 2015-11-07: qty 1

## 2015-11-07 MED ORDER — MIDAZOLAM HCL 2 MG/2ML IJ SOLN
INTRAMUSCULAR | Status: DC | PRN
  Administered 2015-11-07 (×2): 1 mg via INTRAVENOUS

## 2015-11-07 MED ORDER — BUPIVACAINE-EPINEPHRINE (PF) 0.25% -1:200000 IJ SOLN
INTRAMUSCULAR | Status: AC
  Filled 2015-11-07: qty 30

## 2015-11-07 MED ORDER — ACETAMINOPHEN 650 MG RE SUPP: 650.0000 mg | RECTAL | Status: DC | PRN

## 2015-11-07 MED ORDER — PROPOFOL 10 MG/ML IV BOLUS
INTRAVENOUS | Status: AC
  Filled 2015-11-07: qty 20

## 2015-11-07 MED ORDER — GLYCOPYRROLATE 0.2 MG/ML IJ SOLN
INTRAMUSCULAR | Status: DC | PRN
  Administered 2015-11-07: 0.2 mg via INTRAVENOUS

## 2015-11-07 MED ORDER — MORPHINE SULFATE (PF) 2 MG/ML IV SOLN
2.0000 mg | INTRAVENOUS | Status: DC | PRN
  Administered 2015-11-07: 2 mg via INTRAVENOUS
  Filled 2015-11-07: qty 1

## 2015-11-07 MED ORDER — ALBUTEROL SULFATE HFA 108 (90 BASE) MCG/ACT IN AERS
INHALATION_SPRAY | RESPIRATORY_TRACT | Status: AC
  Filled 2015-11-07: qty 6.7

## 2015-11-07 MED ORDER — MORPHINE SULFATE (PF) 2 MG/ML IV SOLN: 2.0000 mg | INTRAVENOUS | Status: DC | PRN

## 2015-11-07 MED ORDER — ALBUTEROL SULFATE HFA 108 (90 BASE) MCG/ACT IN AERS
INHALATION_SPRAY | RESPIRATORY_TRACT | Status: DC | PRN
  Administered 2015-11-07: 2 via RESPIRATORY_TRACT

## 2015-11-07 MED ORDER — PROPOFOL 500 MG/50ML IV EMUL
INTRAVENOUS | Status: DC | PRN
  Administered 2015-11-07: 25 ug/kg/min via INTRAVENOUS

## 2015-11-07 MED ORDER — KETOROLAC TROMETHAMINE 30 MG/ML IJ SOLN
INTRAMUSCULAR | Status: AC
  Filled 2015-11-07: qty 1

## 2015-11-07 MED ORDER — LACTATED RINGERS IV SOLN
INTRAVENOUS | Status: DC
Start: 2015-11-07 — End: 2015-11-07
  Administered 2015-11-07 (×2): via INTRAVENOUS

## 2015-11-07 MED ORDER — ACETAMINOPHEN 325 MG PO TABS
650.0000 mg | ORAL_TABLET | ORAL | Status: DC | PRN
  Administered 2015-11-07: 650 mg via ORAL

## 2015-11-07 MED ORDER — SUGAMMADEX SODIUM 200 MG/2ML IV SOLN
INTRAVENOUS | Status: DC | PRN
  Administered 2015-11-07: 200 mg via INTRAVENOUS

## 2015-11-07 MED ORDER — FENTANYL CITRATE (PF) 100 MCG/2ML IJ SOLN
INTRAMUSCULAR | Status: DC | PRN
  Administered 2015-11-07: 100 ug via INTRAVENOUS
  Administered 2015-11-07: 50 ug via INTRAVENOUS

## 2015-11-07 MED ORDER — SCOPOLAMINE 1 MG/3DAYS TD PT72: 1.0000 | MEDICATED_PATCH | TRANSDERMAL | Status: DC

## 2015-11-07 MED ORDER — ARTIFICIAL TEARS OP OINT
TOPICAL_OINTMENT | OPHTHALMIC | Status: AC
  Filled 2015-11-07: qty 3.5

## 2015-11-07 MED ORDER — FENTANYL CITRATE (PF) 100 MCG/2ML IJ SOLN
25.0000 ug | INTRAMUSCULAR | Status: DC | PRN
  Administered 2015-11-07 (×4): 25 ug via INTRAVENOUS
  Administered 2015-11-07: 50 ug via INTRAVENOUS

## 2015-11-07 MED ORDER — HEPARIN SODIUM (PORCINE) 5000 UNIT/ML IJ SOLN: 5000.0000 [IU] | Freq: Three times a day (TID) | INTRAMUSCULAR | Status: DC

## 2015-11-07 MED ORDER — SODIUM CHLORIDE 0.9% FLUSH: 3.0000 mL | Freq: Two times a day (BID) | INTRAVENOUS | Status: DC

## 2015-11-07 MED ORDER — ROCURONIUM BROMIDE 100 MG/10ML IV SOLN
INTRAVENOUS | Status: DC | PRN
  Administered 2015-11-07: 40 mg via INTRAVENOUS

## 2015-11-07 MED ORDER — OXYCODONE HCL 5 MG PO TABS
5.0000 mg | ORAL_TABLET | ORAL | Status: DC | PRN
  Administered 2015-11-07 – 2015-11-08 (×3): 10 mg via ORAL
  Filled 2015-11-07 (×2): qty 2

## 2015-11-07 MED ORDER — LIDOCAINE HCL (CARDIAC) 20 MG/ML IV SOLN
INTRAVENOUS | Status: DC | PRN
  Administered 2015-11-07: 100 mg via INTRATRACHEAL

## 2015-11-07 MED ORDER — PROMETHAZINE HCL 25 MG/ML IJ SOLN
INTRAMUSCULAR | Status: AC
  Filled 2015-11-07: qty 1

## 2015-11-07 MED ORDER — OXYCODONE HCL 5 MG PO TABS
ORAL_TABLET | ORAL | Status: AC
  Filled 2015-11-07: qty 2

## 2015-11-07 MED ORDER — FENTANYL CITRATE (PF) 100 MCG/2ML IJ SOLN
INTRAMUSCULAR | Status: AC
  Administered 2015-11-07: 25 ug via INTRAVENOUS
  Filled 2015-11-07: qty 2

## 2015-11-07 MED ORDER — ONDANSETRON HCL 4 MG/2ML IJ SOLN
INTRAMUSCULAR | Status: AC
  Administered 2015-11-07: 4 mg via INTRAVENOUS
  Filled 2015-11-07: qty 2

## 2015-11-07 MED ORDER — IOPAMIDOL (ISOVUE-300) INJECTION 61%
INTRAVENOUS | Status: AC
  Filled 2015-11-07: qty 50

## 2015-11-07 SURGICAL SUPPLY — 42 items
APPLIER CLIP 5 13 M/L LIGAMAX5 (MISCELLANEOUS) ×2
BENZOIN TINCTURE PRP APPL 2/3 (GAUZE/BANDAGES/DRESSINGS) ×2 IMPLANT
CANISTER SUCTION 2500CC (MISCELLANEOUS) ×2 IMPLANT
CATH REDDICK CHOLANGI 4FR 50CM (CATHETERS) ×2 IMPLANT
CHLORAPREP W/TINT 26ML (MISCELLANEOUS) ×2 IMPLANT
CLIP APPLIE 5 13 M/L LIGAMAX5 (MISCELLANEOUS) ×1 IMPLANT
CLSR STERI-STRIP ANTIMIC 1/2X4 (GAUZE/BANDAGES/DRESSINGS) ×2 IMPLANT
COVER MAYO STAND STRL (DRAPES) ×2 IMPLANT
COVER SURGICAL LIGHT HANDLE (MISCELLANEOUS) ×2 IMPLANT
DECANTER SPIKE VIAL GLASS SM (MISCELLANEOUS) ×2 IMPLANT
DRAPE C-ARM 42X72 X-RAY (DRAPES) ×2 IMPLANT
DRSG TEGADERM 2-3/8X2-3/4 SM (GAUZE/BANDAGES/DRESSINGS) ×2 IMPLANT
ELECT REM PT RETURN 9FT ADLT (ELECTROSURGICAL) ×2
ELECTRODE REM PT RTRN 9FT ADLT (ELECTROSURGICAL) ×1 IMPLANT
GAUZE SPONGE 2X2 8PLY STRL LF (GAUZE/BANDAGES/DRESSINGS) ×1 IMPLANT
GLOVE BIOGEL PI IND STRL 8 (GLOVE) ×1 IMPLANT
GLOVE BIOGEL PI INDICATOR 8 (GLOVE) ×1
GLOVE ECLIPSE 8.0 STRL XLNG CF (GLOVE) ×2 IMPLANT
GOWN STRL REUS W/ TWL LRG LVL3 (GOWN DISPOSABLE) ×3 IMPLANT
GOWN STRL REUS W/TWL LRG LVL3 (GOWN DISPOSABLE) ×3
IV CATH 14GX2 1/4 (CATHETERS) ×2 IMPLANT
KIT BASIN OR (CUSTOM PROCEDURE TRAY) ×2 IMPLANT
KIT ROOM TURNOVER OR (KITS) ×2 IMPLANT
NS IRRIG 1000ML POUR BTL (IV SOLUTION) ×2 IMPLANT
PAD ARMBOARD 7.5X6 YLW CONV (MISCELLANEOUS) ×2 IMPLANT
POUCH RETRIEVAL ECOSAC 10 (ENDOMECHANICALS) ×1 IMPLANT
POUCH RETRIEVAL ECOSAC 10MM (ENDOMECHANICALS) ×1
POUCH SPECIMEN RETRIEVAL 10MM (ENDOMECHANICALS) ×2 IMPLANT
SCISSORS LAP 5X35 DISP (ENDOMECHANICALS) ×2 IMPLANT
SET IRRIG TUBING LAPAROSCOPIC (IRRIGATION / IRRIGATOR) ×2 IMPLANT
SLEEVE ENDOPATH XCEL 5M (ENDOMECHANICALS) ×4 IMPLANT
SPECIMEN JAR SMALL (MISCELLANEOUS) ×2 IMPLANT
SPONGE GAUZE 2X2 STER 10/PKG (GAUZE/BANDAGES/DRESSINGS) ×1
STRIP CLOSURE SKIN 1/2X4 (GAUZE/BANDAGES/DRESSINGS) ×2 IMPLANT
SUT MON AB 4-0 PC3 18 (SUTURE) ×2 IMPLANT
TOWEL OR 17X24 6PK STRL BLUE (TOWEL DISPOSABLE) ×2 IMPLANT
TOWEL OR 17X26 10 PK STRL BLUE (TOWEL DISPOSABLE) ×2 IMPLANT
TRAY LAPAROSCOPIC MC (CUSTOM PROCEDURE TRAY) ×2 IMPLANT
TROCAR XCEL BLUNT TIP 100MML (ENDOMECHANICALS) ×2 IMPLANT
TROCAR XCEL NON-BLD 11X100MML (ENDOMECHANICALS) IMPLANT
TROCAR XCEL NON-BLD 5MMX100MML (ENDOMECHANICALS) ×2 IMPLANT
TUBING INSUFFLATION (TUBING) ×2 IMPLANT

## 2015-11-07 NOTE — Discharge Instructions (Addendum)
LAPAROSCOPIC SURGERY: POST OP INSTRUCTIONS  1. DIET: Follow a liquid diet the first 24 hours after arrival home, such as soup, liquids, crackers, etc.  Be sure to include lots of fluids daily.  Avoid fast food or heavy meals as your are more likely to get nauseated.  Eat a low fat the next few days after surgery.   2. Take your usually prescribed home medications unless otherwise directed. 3. PAIN CONTROL: a. Pain is best controlled by a usual combination of three different methods TOGETHER: i. Ice/Heat ii. Over the counter pain medication iii. Prescription pain medication b. Most patients will experience some swelling and bruising around the incisions.  Ice packs or heating pads (30-60 minutes up to 6 times a day) will help. Use ice for the first few days to help decrease swelling and bruising, then switch to heat to help relax tight/sore spots and speed recovery.  Some people prefer to use ice alone, heat alone, alternating between ice & heat.  Experiment to what works for you.  Swelling and bruising can take several weeks to resolve.   c. It is helpful to take an over-the-counter pain medication regularly for the first few weeks.  Choose one of the following that works best for you: i. Naproxen (Aleve, etc)  Two 220mg  tabs twice a day ii. Ibuprofen (Advil, etc) Three 200mg  tabs four times a day (every meal & bedtime) iii. Acetaminophen (Tylenol, etc) 500-650mg  four times a day (every meal & bedtime) d. A  prescription for pain medication (such as oxycodone, hydrocodone, etc) should be given to you upon discharge.  Take your pain medication as prescribed.  i. If you are having problems/concerns with the prescription medicine (does not control pain, nausea, vomiting, rash, itching, etc), please call us [phone] to see if we need to switch you to a different pain medicine that will work better for you and/or control your side effect better. ii. If you need a refill on your pain medication,  please contact your pharmacy.  They will contact our office to request authorization. Prescriptions will not be filled after 5 pm or on week-ends. 4. Avoid getting constipated.  Between the surgery and the pain medications, it is common to experience some constipation.  Increasing fluid intake and taking a fiber supplement (such as Metamucil, Citrucel, FiberCon, MiraLax, etc) 1-2 times a day regularly will usually help prevent this problem from occurring.  A mild laxative (prune juice, Milk of Magnesia, MiraLax, etc) should be taken according to package directions if there are no bowel movements after 48 hours.   5. Watch out for diarrhea.  If you have many loose bowel movements, simplify your diet to bland foods & liquids for a few days.  Stop any stool softeners and decrease your fiber supplement.  Switching to mild anti-diarrheal medications (Kayopectate, Pepto Bismol) can help.  If this worsens or does not improve, please call us. 6. Wash / shower every day.  You may shower over the dressings as they are waterproof.  Continue to shower over incision(s) after the dressing is off. 7. Remove your waterproof bandages 3 days after surgery.  You may leave the incision open to air.  You may replace a dressing/Band-Aid to cover the incision for comfort if you wish.  8. ACTIVITIES as tolerated:   a. You may resume regular (light) daily activities beginning the next day--such as daily self-care, walking, climbing stairs--gradually increasing light activities as tolerated.  No heavy lifting (over 10 pounds), straining, or intense  activities for 2 weeks. b. DO NOT PUSH THROUGH PAIN.  Let pain be your guide: If it hurts to do something, don't do it.  Pain is your body warning you to avoid that activity for another week until the pain goes down. c. You may drive when you are no longer taking prescription pain medication, you can comfortably wear a seatbelt, and you can safely maneuver your car and apply  brakes. d. Dennis Bast may have sexual intercourse when it is comfortable.  9. FOLLOW UP in our office a. Please call [facility] at [phone] to set up an appointment to see your surgeon in the office for a follow-up appointment approximately 2-3 weeks after your surgery. b. Make sure that you call for this appointment the day you arrive home to insure a convenient appointment time. 10. IF YOU HAVE DISABILITY OR FAMILY LEAVE FORMS, BRING THEM TO THE OFFICE FOR PROCESSING.  DO NOT GIVE THEM TO YOUR DOCTOR.  11.  Return to work/school:  Desk work/light activities in 5-7 days, full duty/activities in 2 weeks if pain-free.   WHEN TO CALL us [phone]: 1. Poor pain control 2. Reactions / problems with new medications (rash/itching, nausea, etc)  3. Fever over 101.5 F (38.5 C) 4. Inability to urinate 5. Nausea and/or vomiting 6. Worsening swelling or bruising 7. Continued bleeding from incision. 8. Increased pain, redness, or drainage from the incision   The clinic staff is available to answer your questions during regular business hours (8:30am-5pm).  Please dont hesitate to call and ask to speak to one of our nurses for clinical concerns.   If you have a medical emergency, go to the nearest emergency room or call 911.  A surgeon from [facility] is always on call at the [facility], [place], [place], [place], [place]  [address] ? MAIN: [phone] ? TOLL FREE: [phone] ?  FAX [phone] [email or web address]

## 2015-11-07 NOTE — Interval H&P Note (Signed)
History and Physical Interval Note:  11/07/2015 12:12 PM  [name] Riley  has presented today for surgery, with the diagnosis of symptomatic cholelithiasis  The various methods of treatment have been discussed with the patient and family. After consideration of risks, benefits and other options for treatment, the patient has consented to  Procedure(s): LAPAROSCOPIC CHOLECYSTECTOMY WITH INTRAOPERATIVE CHOLANGIOGRAM AND X-RAY BILE DUCT (N/A) as a surgical intervention .  The patient's history has been reviewed, patient examined, no change in status, stable for surgery.  I have reviewed the patient's chart and labs.  Questions were answered to the patient's satisfaction.     Ivan Maskell Lenna Sciara

## 2015-11-07 NOTE — Transfer of Care (Signed)
Immediate Anesthesia Transfer of Care Note  Patient: [name] Riley  Procedure(s) Performed: Procedure(s): LAPAROSCOPIC CHOLECYSTECTOMY WITH INTRAOPERATIVE CHOLANGIOGRAM AND X-RAY BILE DUCT (N/A)  Patient Location: PACU  Anesthesia Type:General  Level of Consciousness: awake, alert  and patient cooperative  Airway & Oxygen Therapy: Patient Spontanous Breathing and Patient connected to face mask oxygen  Post-op Assessment: Report given to RN, Post -op Vital signs reviewed and stable, Patient moving all extremities X 4 and Patient able to stick tongue midline  Post vital signs: Reviewed and stable  Last Vitals:  Vitals:   11/07/15 1023  BP: (!) 149/57  Pulse: (!) 53  Resp: 20  Temp: 36.9 C    Last Pain:  Vitals:   11/07/15 1023  TempSrc: Oral      Patients Stated Pain Goal: 5 ([id number])  Complications: No apparent anesthesia complications

## 2015-11-07 NOTE — Anesthesia Procedure Notes (Signed)
Procedure Name: Intubation Date/Time: 11/07/2015 1:14 PM Performed by: Nilda Simmer Pre-anesthesia Checklist: Patient identified, Emergency Drugs available, Suction available and Patient being monitored Patient Re-evaluated:Patient Re-evaluated prior to inductionOxygen Delivery Method: Circle system utilized Preoxygenation: Pre-oxygenation with 100% oxygen Intubation Type: IV induction Ventilation: Oral airway inserted - appropriate to patient size Laryngoscope Size: Mac and 3 Grade View: Grade I Tube type: Oral Number of attempts: 1 Airway Equipment and Method: Stylet Placement Confirmation: ETT inserted through vocal cords under direct vision,  positive ETCO2 and breath sounds checked- equal and bilateral Secured at: 23 cm Tube secured with: Tape Dental Injury: Teeth and Oropharynx as per pre-operative assessment

## 2015-11-07 NOTE — Op Note (Signed)
OPERATIVE NOTE-LAPAROSCOPIC CHOLECYSTECTOMY  Preoperative diagnosis:  Symptomatic cholelithiasis  Postoperative diagnosis:  Same  Procedure: Laparoscopic cholecystectomy with cholangiogram.  Surgeon: Jackolyn Confer, M.D.  Asst.:  Erroll Luna, M.D.  Anesthesia: General  Indication:   This is a 65 year old female who had asymptomatic gallstones that have now become symptomatic. She now presents for elective cholecystectomy. Of note is that she's had a previous umbilical hernia repair with mesh.  Technique: She was brought to the operating room, placed supine on the operating table, and a general anesthetic was administered. The abdominal wall was then sterilely prepped and draped.  A timeout was performed.    Local anesthetic was infiltrated in the right upper quadrant area. She is placed in slight reverse Trendelenburg position. Using a 5 mm Optiview trocar and 5 mm laparoscope, access was gained into the peritoneal cavity and a pneumoperitoneum was created. Inspection of the area under the trocar demonstrated no evidence of bleeding or organ injury. I visualized the periumbilical region. There are adhesions to the umbilical area but area to the right of it was free of adhesions. An 11 mm trocar was then placed just to the right of the umbilicus under laparoscopic vision. Likewise, a 5 mm trocar was then placed through an epigastric incision and another 5 mm trochars placed in the right lateral abdomen.  The gallbladder was visualized with omentum adherent to it. These adhesions were separated bluntly and sharply. Following this, the fundus was grasped and retracted toward the right shoulder.  The infundibulum was mobilized with dissection close to the gallbladder and retracted laterally. The cystic duct was identified and a window was created around it. The cystic artery was also identified and a window was created around it. The critical view was achieved. A clip was placed at the neck of  the gallbladder. A small incision was made in the cystic duct. A cholangiocatheter was introduced through the anterior abdominal wall and placed in the cystic duct. A intraoperative cholangiogram was then performed.  Under real-time fluoroscopy, dilute contrast was injected into the cystic duct.  The common hepatic duct, the right and left hepatic ducts, and the common duct were all visualized. Contrast drained into the duodenum without obvious evidence of any obstructing ductal lesion. The final report is pending the Radiologist's interpretation.  The cholangiocatheter was removed, the cystic duct was clipped 3 times on the biliary side, and then the cystic duct was divided sharply. No bile leak was noted from the cystic duct stump.  The cystic artery was then clipped and divided. Following this the gallbladder was dissected free from the liver using electrocautery. There was some spillage of bile from the gallbladder but no spillage of stones. The gallbladder was then placed in a retrieval bag and removed from the abdominal cavity through the subumbilical incision.  The gallbladder fossa was inspected, copiously irrigated, and bleeding was controlled with electrocautery. Inspection showed that hemostasis was adequate and there was no evidence of bile leak.  The irrigation fluid was evacuated as much as possible.  The trocars were removed and the pneumoperitoneum was released. The skin incisions were closed with 4-0 Monocryl subcuticular stitches. Steri-Strips and sterile dressings were applied.  The procedure was well-tolerated without any apparent complications. She was taken to the recovery room in satisfactory condition.

## 2015-11-07 NOTE — H&P (View-Only) (Signed)
[name] Riley 11/01/2015 3:48 PM Location: [facility] Patient #: [id number] DOB: 1950-04-20 Divorced / Language: [name] / Race: White Female  History of Present Illness Odis Hollingshead MD; 11/01/2015 4:43 PM) The patient is a 65 year old female.   Note:She presents for follow-up visit because she has developed symptomatic cholelithiasis. I saw her back in April this year. At that time she had asymptomatic cholelithiasis. 11 days ago, she developed a symptomatic episode with pressure type epigastric pain radiating through to the back associated with nausea and vomiting. Sometimes she wakes up at night and some nausea and vomiting and pain. No fever or chills. She saw Dr. Tamala Julian. No elevation of white blood cell count. Liver function tests were normal.  Allergies (April Staton, CMA; 11/01/2015 3:50 PM) Codeine Phosphate *ANALGESICS - OPIOID*  Medication History (April Staton, CMA; 11/01/2015 3:58 PM) TraZODone HCl (50MG  Tablet, Oral) Active. ALPRAZolam (0.25MG  Tablet, Oral) Active. Lisinopril (10MG  Tablet, Oral) Active. Lexapro (20MG  Tablet, Oral) Active. Lipitor (20MG  Tablet, Oral) Active. Crestor (5MG  Tablet, Oral) Active. Turmeric (500MG  Tablet, Oral) Active. Centrum Adults (Oral) Active. Caltrate 600 Plus-Vit D (600-200MG -UNIT Tablet, Oral) Active. Fish Oil (Oral) Specific dose unknown - Active. Medications Reconciled    Vitals (April Staton CMA; 11/01/2015 4:06 PM) 11/01/2015 3:58 PM Weight: 153.13 lb Height: 61in Height was reported by patient. Body Surface Area: 1.69 m Body Mass Index: 28.93 kg/m  Temp.: 98.11F(Oral)  Pulse: 56 (Regular)  P.OX: 95% (Room air) BP: 140/62 (Sitting, Left Arm, Standard)      Physical Exam Odis Hollingshead MD; 11/01/2015 4:39 PM)  The physical exam findings are as follows: Note:General: WDWN in NAD. Pleasant and cooperative.  EYES: EOMI, no scleral icterus  CV: RRR, no murmur, no  edema  ABDOMEN: Soft, very mild epigastric tenderness, nondistended, no masses, no organomegaly, active bowel sounds, periumbilical scar  SKIN: No jaundice.  NEUROLOGIC: Alert and oriented, answers questions appropriately.  PSYCHIATRIC: Normal mood, affect , and behavior.    Assessment & Plan Odis Hollingshead MD; 11/01/2015 4:40 PM)  SYMPTOMATIC CHOLELITHIASIS (K80.20) Impression: She has developed this over the past 10-11 days.  Plan: Schedule laparoscopic possible open cholecystectomy with cholangiogram. I have explained the procedure, risks, and aftercare of cholecystectomy. Risks include but are not limited to bleeding, infection, wound problems, anesthesia, diarrhea, bile leak, injury to common bile duct/liver/intestine. She seems to understand and agrees to proceed.  Jackolyn Confer, M.D.

## 2015-11-07 NOTE — Anesthesia Postprocedure Evaluation (Signed)
Anesthesia Post Note  Patient: [name] Riley  Procedure(s) Performed: Procedure(s) (LRB): LAPAROSCOPIC CHOLECYSTECTOMY WITH INTRAOPERATIVE CHOLANGIOGRAM AND X-RAY BILE DUCT (N/A)  Patient location during evaluation: PACU Anesthesia Type: General Level of consciousness: awake and alert Pain management: pain level controlled Vital Signs Assessment: post-procedure vital signs reviewed and stable Respiratory status: spontaneous breathing, nonlabored ventilation and respiratory function stable Cardiovascular status: blood pressure returned to baseline and stable Postop Assessment: no signs of nausea or vomiting Anesthetic complications: no    Last Vitals:  Vitals:   11/07/15 1445 11/07/15 1500  BP: (!) 147/85 (!) 160/81  Pulse: 71 70  Resp: 14 17  Temp:      Last Pain:  Vitals:   11/07/15 1023  TempSrc: Oral                 Nilda Simmer

## 2015-11-08 ENCOUNTER — Encounter (HOSPITAL_COMMUNITY): Payer: Self-pay | Admitting: General Surgery

## 2015-11-08 DIAGNOSIS — K8010 Calculus of gallbladder with chronic cholecystitis without obstruction: Secondary | ICD-10-CM | POA: Diagnosis not present

## 2015-11-08 NOTE — Progress Notes (Signed)
Pt discharged to home.  Discharge instructions explained to pt.  Pt has no questions at the time of discharge.  IV removed.  Pt states she has all belongings.  Pt taken off unit in wheelchair by volunteer services.

## 2015-11-08 NOTE — Progress Notes (Signed)
1 Day Post-Op  Subjective: Feels much better this AM.  More alert.  No nausea.  Pain better. Son in room.  Objective: Vital signs in last 24 hours: Temp:  [98 F (36.7 C)-98.7 F (37.1 C)] 98.6 F (37 C) (09/29 0610) Pulse Rate:  [53-84] 66 (09/29 0610) Resp:  [11-20] 16 (09/29 0610) BP: (133-177)/(49-94) 148/58 (09/29 0610) SpO2:  [93 %-99 %] 95 % (09/29 0610) Weight:  [68.5 kg (151 lb)-73.6 kg (162 lb 4.1 oz)] 73.6 kg (162 lb 4.1 oz) (09/28 1935) Last BM Date: 11/06/15  Intake/Output from previous day: 09/28 0701 - 09/29 0700 In: 2130 [P.O.:180; I.V.:1950] Out: 725 [Urine:700; Blood:25] Intake/Output this shift: No intake/output data recorded.  PE: General- In NAD Abdomen-soft, dressings dry  Lab Results:  No results for input(s): WBC, HGB, HCT, PLT in the last 72 hours. BMET No results for input(s): NA, K, CL, CO2, GLUCOSE, BUN, CREATININE, CALCIUM in the last 72 hours. PT/INR No results for input(s): LABPROT, INR in the last 72 hours. Comprehensive Metabolic Panel:    Component Value Date/Time   NA 142 07/02/2014 0737   NA 137 06/04/2014 0738   K 3.6 07/02/2014 0737   K 3.4 (L) 06/04/2014 0738   CL 108 07/02/2014 0737   CL 101 06/04/2014 0738   CO2 26 07/02/2014 0737   CO2 30 06/04/2014 0738   BUN 11 07/02/2014 0737   BUN 14 06/04/2014 0738   CREATININE 0.87 07/02/2014 0737   CREATININE 0.85 06/04/2014 0738   GLUCOSE 163 (H) 07/02/2014 0737   GLUCOSE 105 (H) 06/04/2014 0738   CALCIUM 9.3 07/02/2014 0737   CALCIUM 9.6 06/04/2014 0738   AST 35 06/04/2014 0738   AST 22 12/08/2006 1506   ALT 57 (H) 06/04/2014 0738   ALT 32 12/08/2006 1506   ALKPHOS 53 06/04/2014 0738   ALKPHOS 73 12/08/2006 1506   BILITOT 1.0 06/04/2014 0738   BILITOT 2.5 (H) 12/08/2006 1506   PROT 7.0 06/04/2014 0738   PROT 6.9 12/08/2006 1506   ALBUMIN 4.3 06/04/2014 0738   ALBUMIN 3.9 12/08/2006 1506     Studies/Results: Dg Cholangiogram Operative  Result Date:  11/07/2015 CLINICAL DATA:  Cholelithiasis. EXAM: INTRAOPERATIVE CHOLANGIOGRAM TECHNIQUE: Cholangiographic images from the C-arm fluoroscopic device were submitted for interpretation post-operatively. Please see the procedural report for the amount of contrast and the fluoroscopy time utilized. COMPARISON:  Ultrasound 01/24/2015 FINDINGS: Contrast fills the intrahepatic and extrahepatic biliary system. Contrast drains into the duodenum without large filling defects or stones. No biliary dilatation. IMPRESSION: Normal intraoperative cholangiogram.  Patent biliary system. Electronically Signed   By: Markus Daft M.D.   On: 11/07/2015 14:06    Anti-infectives: Anti-infectives    Start     Dose/Rate Route Frequency Ordered Stop   11/07/15 1036  ceFAZolin (ANCEF) 2-4 GM/100ML-% IVPB    Comments:  Nyoka Cowden   : cabinet override      11/07/15 1036 11/07/15 1306   11/07/15 1023  ceFAZolin (ANCEF) IVPB 2g/100 mL premix     2 g 200 mL/hr over 30 Minutes Intravenous On call to O.R. 11/07/15 1023 11/07/15 1306      Assessment Active Problems:   Symptomatic cholelithiasis s/p lap chole with IOC 11/07/15-doing much better this AM    LOS: 0 days   Plan: Discharge today.  Instructions given.  Chikita Dogan Lenna Sciara 11/08/2015

## 2017-05-17 ENCOUNTER — Other Ambulatory Visit: Payer: Self-pay | Admitting: Family Medicine

## 2017-05-17 ENCOUNTER — Ambulatory Visit
Admission: RE | Admit: 2017-05-17 | Discharge: 2017-05-17 | Disposition: A | Discharge status: Home / Self Care | Payer: 59 | Source: Ambulatory Visit | Attending: Family Medicine | Admitting: Family Medicine

## 2017-05-17 DIAGNOSIS — R109 Unspecified abdominal pain: Secondary | ICD-10-CM

## 2017-10-21 IMAGING — RF DG CHOLANGIOGRAM OPERATIVE
1 series · 5 of 5 positions shown · non-contrast
Comparison: Ultrasound 01/24/2015

CLINICAL DATA: Cholelithiasis.

EXAM:
INTRAOPERATIVE CHOLANGIOGRAM
TECHNIQUE: Cholangiographic images from the C-arm fluoroscopic device were
submitted for interpretation post-operatively. Please see the
procedural report for the amount of contrast and the fluoroscopy
time utilized.

[Series 1: run · 2 acquisitions, 5 frames shown]
[im 1/2]
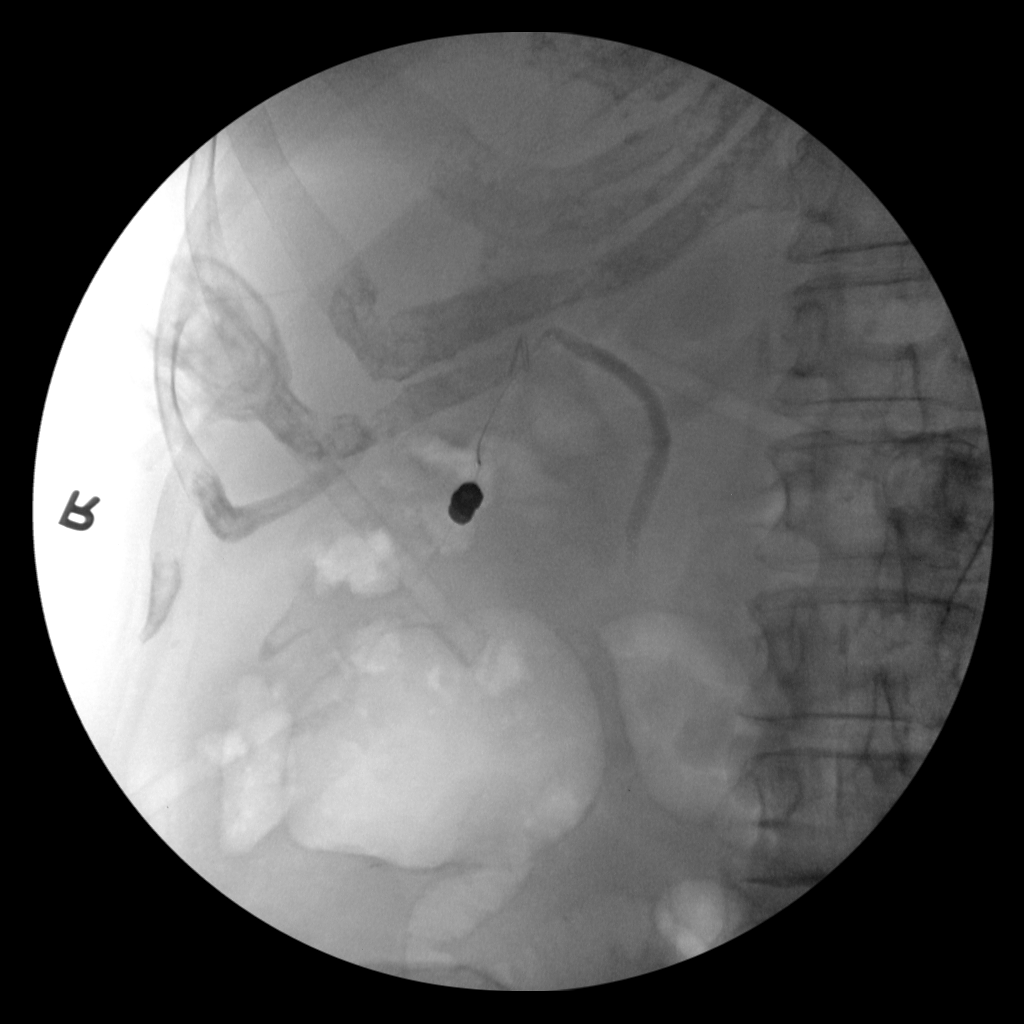
[im 1/2]
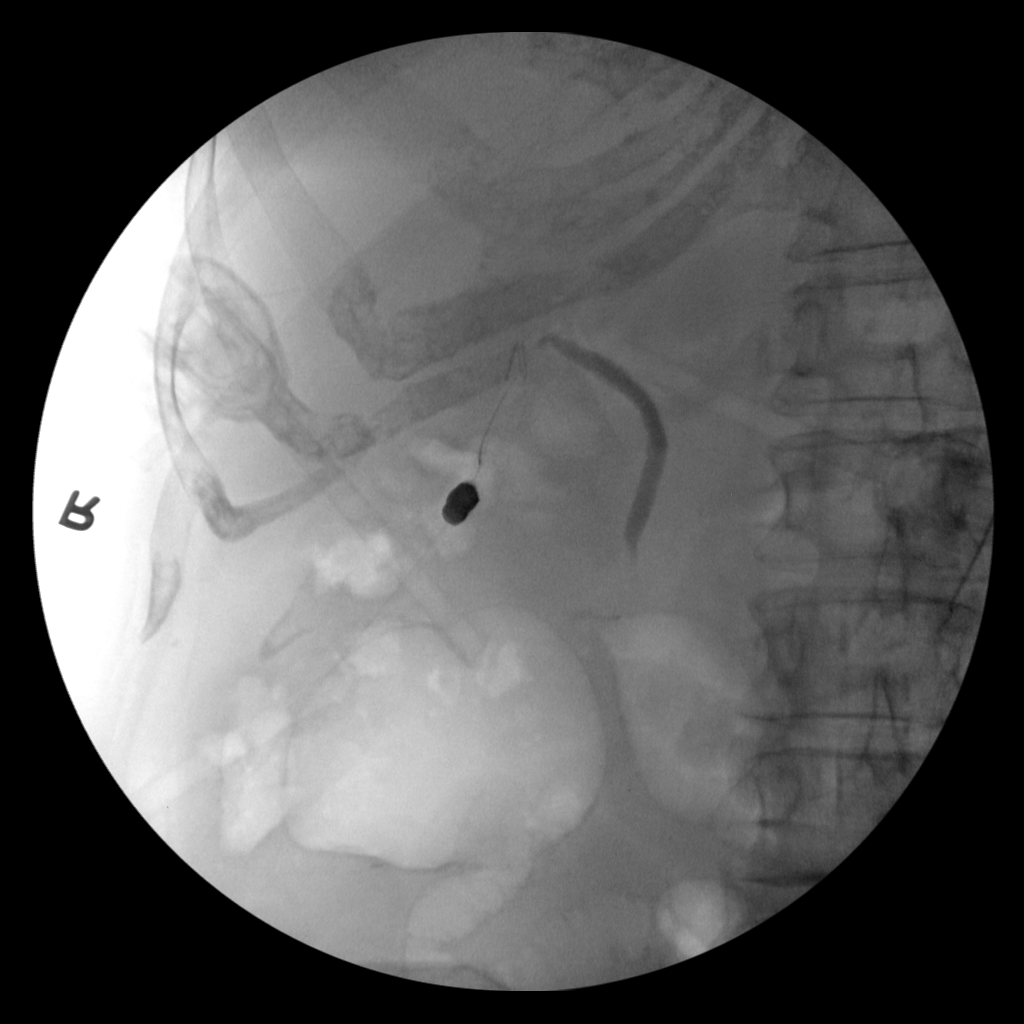
[im 1/2]
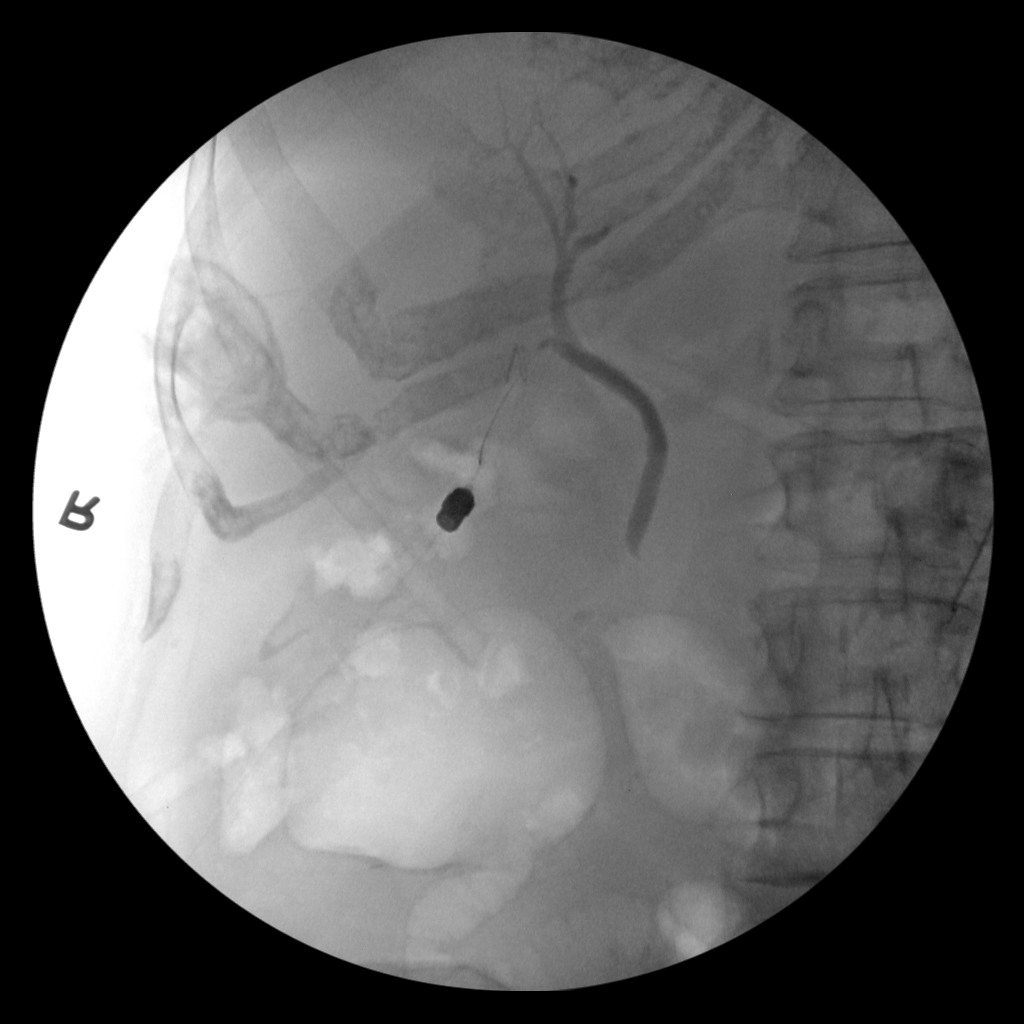
[im 1/2]
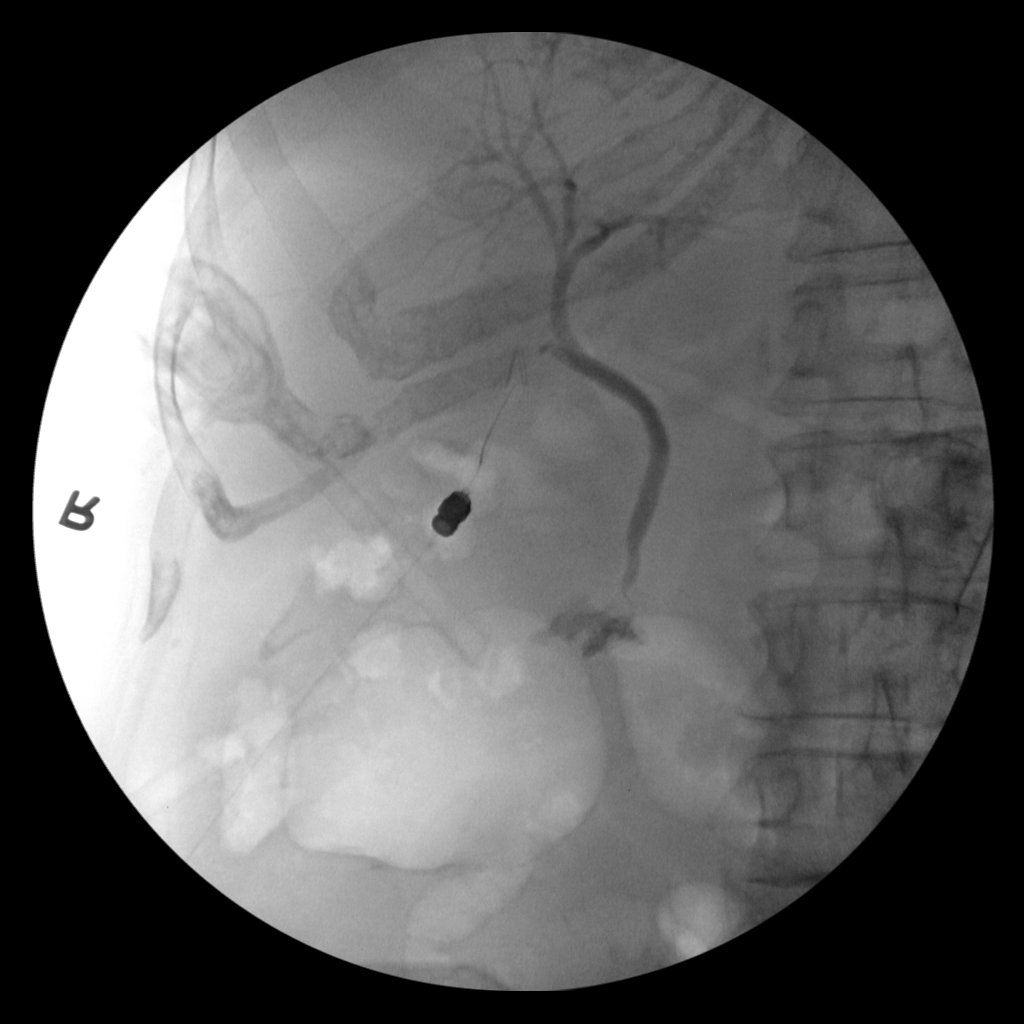
[im 2/2]
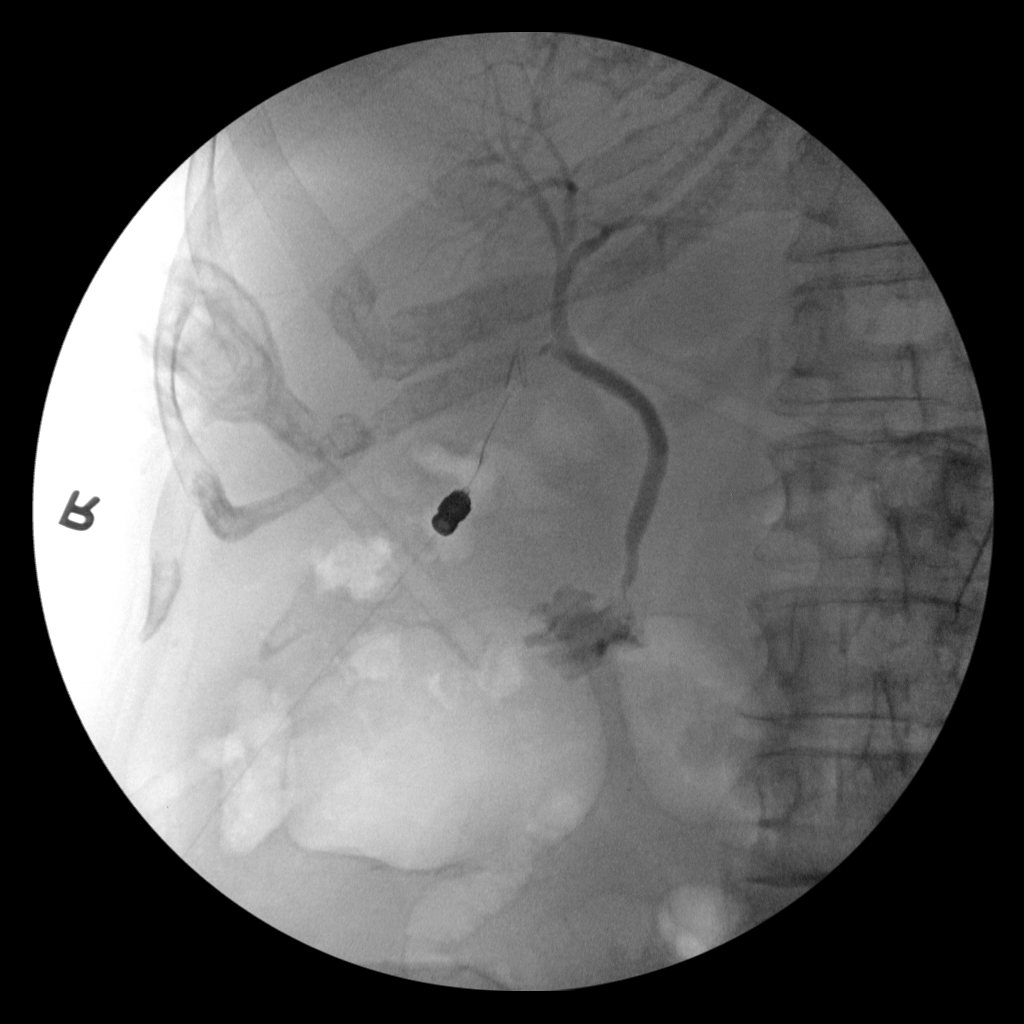

[5 of 5 positions shown; findings below may reference images not displayed]

FINDINGS: Contrast fills the intrahepatic and extrahepatic biliary system.
Contrast drains into the duodenum without large filling defects or
stones. No biliary dilatation.
IMPRESSION: Normal intraoperative cholangiogram.  Patent biliary system.

## 2020-05-12 ENCOUNTER — Other Ambulatory Visit: Payer: Self-pay

## 2020-05-12 ENCOUNTER — Encounter (HOSPITAL_COMMUNITY): Payer: Self-pay | Admitting: Emergency Medicine

## 2020-05-12 ENCOUNTER — Emergency Department (HOSPITAL_COMMUNITY): Payer: Medicare Other

## 2020-05-12 ENCOUNTER — Emergency Department (HOSPITAL_COMMUNITY)
Admission: EM | Admit: 2020-05-12 | Discharge: 2020-05-13 | Disposition: A | Discharge status: Home / Self Care | Payer: Medicare Other | Attending: Emergency Medicine | Admitting: Emergency Medicine

## 2020-05-12 DIAGNOSIS — Z87891 Personal history of nicotine dependence: Secondary | ICD-10-CM | POA: Insufficient documentation

## 2020-05-12 DIAGNOSIS — R1030 Lower abdominal pain, unspecified: Secondary | ICD-10-CM | POA: Diagnosis present

## 2020-05-12 DIAGNOSIS — K5792 Diverticulitis of intestine, part unspecified, without perforation or abscess without bleeding: Secondary | ICD-10-CM

## 2020-05-12 DIAGNOSIS — Z79899 Other long term (current) drug therapy: Secondary | ICD-10-CM | POA: Diagnosis not present

## 2020-05-12 DIAGNOSIS — Z9049 Acquired absence of other specified parts of digestive tract: Secondary | ICD-10-CM | POA: Insufficient documentation

## 2020-05-12 DIAGNOSIS — K5732 Diverticulitis of large intestine without perforation or abscess without bleeding: Secondary | ICD-10-CM | POA: Insufficient documentation

## 2020-05-12 DIAGNOSIS — I10 Essential (primary) hypertension: Secondary | ICD-10-CM | POA: Diagnosis not present

## 2020-05-12 LAB — CBC WITH DIFFERENTIAL/PLATELET
Abs Immature Granulocytes: 0.03 10*3/uL (ref 0.00–0.07)
Basophils Absolute: 0 10*3/uL (ref 0.0–0.1)
Basophils Relative: 0 %
Eosinophils Absolute: 0.2 10*3/uL (ref 0.0–0.5)
Eosinophils Relative: 3 %
HCT: 40.7 % (ref 36.0–46.0)
Hemoglobin: 14.3 g/dL (ref 12.0–15.0)
Immature Granulocytes: 0 %
Lymphocytes Relative: 34 %
Lymphs Abs: 2.3 10*3/uL (ref 0.7–4.0)
MCH: 30.8 pg (ref 26.0–34.0)
MCHC: 35.1 g/dL (ref 30.0–36.0)
MCV: 87.5 fL (ref 80.0–100.0)
Monocytes Absolute: 0.9 10*3/uL (ref 0.1–1.0)
Monocytes Relative: 13 %
Neutro Abs: 3.4 10*3/uL (ref 1.7–7.7)
Neutrophils Relative %: 50 %
Platelets: 212 10*3/uL (ref 150–400)
RBC: 4.65 MIL/uL (ref 3.87–5.11)
RDW: 11.9 % (ref 11.5–15.5)
WBC: 6.9 10*3/uL (ref 4.0–10.5)
nRBC: 0 % (ref 0.0–0.2)

## 2020-05-12 LAB — COMPREHENSIVE METABOLIC PANEL
ALT: 41 U/L (ref 0–44)
AST: 28 U/L (ref 15–41)
Albumin: 3.7 g/dL (ref 3.5–5.0)
Alkaline Phosphatase: 50 U/L (ref 38–126)
Anion gap: 8 (ref 5–15)
BUN: 10 mg/dL (ref 8–23)
CO2: 26 mmol/L (ref 22–32)
Calcium: 9.5 mg/dL (ref 8.9–10.3)
Chloride: 107 mmol/L (ref 98–111)
Creatinine, Ser: 0.87 mg/dL (ref 0.44–1.00)
GFR, Estimated: >60 mL/min (ref >60)
Glucose, Bld: 87 mg/dL (ref 70–99)
Potassium: 3.8 mmol/L (ref 3.5–5.1)
Sodium: 141 mmol/L (ref 135–145)
Total Bilirubin: 1.7 mg/dL — ABNORMAL HIGH (ref 0.3–1.2)
Total Protein: 6.9 g/dL (ref 6.5–8.1)

## 2020-05-12 LAB — URINALYSIS, ROUTINE W REFLEX MICROSCOPIC
Bilirubin Urine: NEGATIVE
Glucose, UA: NEGATIVE mg/dL
Hgb urine dipstick: NEGATIVE
Ketones, ur: NEGATIVE mg/dL
Leukocytes,Ua: NEGATIVE
Nitrite: NEGATIVE
Protein, ur: NEGATIVE mg/dL
Specific Gravity, Urine: >1.046 — ABNORMAL HIGH (ref 1.005–1.030)
pH: 5 (ref 5.0–8.0)

## 2020-05-12 LAB — LIPASE, BLOOD: Lipase: 38 U/L (ref 11–51)

## 2020-05-12 MED ORDER — IOHEXOL 300 MG/ML  SOLN: 100.0000 mL | Freq: Once | INTRAMUSCULAR | Status: DC | PRN

## 2020-05-12 MED ORDER — IOHEXOL 300 MG/ML  SOLN
100.0000 mL | Freq: Once | INTRAMUSCULAR | Status: AC | PRN
  Administered 2020-05-12: 100 mL via INTRAVENOUS

## 2020-05-12 NOTE — ED Triage Notes (Signed)
Reports lower abd pain that started on Friday.  Pain worse on R side.  Seen at an [facility] yesterday.  Denies nausea, vomiting, and diarrhea.  States she has had a fever.

## 2020-05-12 NOTE — ED Provider Notes (Signed)
MSE was initiated and I personally evaluated the patient and placed orders (if any) at  4:21 PM on May 12, 2020.  Patient placed in Quick Look pathway, seen and evaluated   Chief Complaint: RLQ abd pain, leukocytosis, fever  HPI:   Patient states she has had progressively worsening right lower quadrant pain since Friday with seen in urgent care was appointment she had a leukocytosis the ER.  She states her pain seems to extend up into her right middle abdomen.  Denies nausea vomiting but endorses intermittent diarrhea.  No history of diverticulitis.  Past surgical history significant for appendectomy and  ROS: "Abdominal pain, loose stool (one) No lightheadedness, chest pain, shortness of breath, cough, congestion. Physical Exam:   Gen: No distress  Neuro: Awake and Alert  Skin: Warm    Focused Exam: Right lower sided tenderness palpation no CVA tenderness on either side.  Skin is warm to touch.   Initiation of care has begun. The patient has been counseled on the process, plan, and necessity for staying for the completion/evaluation, and the remainder of the medical screening examination   The patient appears stable so that the remainder of the MSE may be completed by another provider.   [name] Riley [place], [place] 05/12/20 1623    Arnaldo Natal, MD 05/12/20 [phone]

## 2020-05-13 MED ORDER — SODIUM CHLORIDE 0.9 % IV SOLN
3.0000 g | Freq: Once | INTRAVENOUS | Status: AC
  Administered 2020-05-13: 3 g via INTRAVENOUS
  Filled 2020-05-13: qty 8

## 2020-05-13 MED ORDER — OXYCODONE-ACETAMINOPHEN 5-325 MG PO TABS: 1.0000 | ORAL_TABLET | ORAL | 0 refills | Status: DC | PRN

## 2020-05-13 MED ORDER — AMOXICILLIN-POT CLAVULANATE 875-125 MG PO TABS: 1.0000 | ORAL_TABLET | Freq: Two times a day (BID) | ORAL | 0 refills | Status: DC

## 2020-05-13 NOTE — Discharge Instructions (Addendum)
Schedule follow up with your GI doctor in the next few weeks.

## 2020-05-13 NOTE — ED Provider Notes (Signed)
[facility] EMERGENCY DEPARTMENT Provider Note   CSN: [id number] Arrival date & time: 05/12/20  1545     History Chief Complaint  Patient presents with  . Abdominal Pain    [name] Riley is a 70 y.o. female.  Patient presents to the emergency department with complaints of lower abdominal pain.  Symptoms began 2 days ago.  No associated fever, nausea, vomiting or diarrhea.  She reports previous appendectomy and cholecystectomy.  Pain is more on the right side.  She was seen in urgent care yesterday and was told she should come to the ER to be further evaluated.  She said she did not want to do it yesterday but pain continued today.  She also received a call from urgent care stating that the blood work came back and her white count was 14 so she came to the ER for further evaluation.        Past Medical History:  Diagnosis Date  . Allergy   . Anemia    hx of  . Anxiety   . Bradycardia   . Bronchitis    hx of  . Chronic back pain   . Complication of anesthesia    "hard to wake up" and has bradycardia  . Depression   . Diverticulosis   . DJD (degenerative joint disease)   . Hx of colonoscopy 12/2003  . Hyperlipidemia   . Hypertension   . Insomnia   . PONV (postoperative nausea and vomiting)   . Symptomatic cholelithiasis     Patient Active Problem List   Diagnosis Date Noted  . Symptomatic cholelithiasis 11/07/2015  . Essential hypertension 03/01/2014  . Bradycardia 02/28/2014  . Morbid obesity ([place]) 02/28/2014  . Hyperlipidemia 02/28/2014  . Ventral hernia 11/03/2011    Past Surgical History:  Procedure Laterality Date  . APPENDECTOMY    . btl    . CHOLECYSTECTOMY N/A 11/07/2015   Procedure: LAPAROSCOPIC CHOLECYSTECTOMY WITH INTRAOPERATIVE CHOLANGIOGRAM AND X-RAY BILE DUCT;  Surgeon: Jackolyn Confer, MD;  Location: [facility];  Service: General;  Laterality: N/A;  . COLONOSCOPY    . HERNIA REPAIR    . INSERTION OF MESH  12/17/2011   Procedure:  INSERTION OF MESH;  Surgeon: Imogene Burn. Georgette Dover, MD;  Location: [facility];  Service: General;  Laterality: N/A;  . KNEE SURGERY     right knee  . neck discectomy    . TUBAL LIGATION    . VENTRAL HERNIA REPAIR  12/17/2011   Procedure: HERNIA REPAIR VENTRAL ADULT;  Surgeon: Imogene Burn. Georgette Dover, MD;  Location: [facility];  Service: General;  Laterality: N/A;     OB History   No obstetric history on file.     Family History  Problem Relation Age of Onset  . Heart disease Mother   . Hypertension Mother   . Heart attack Mother        x2  . Stroke Mother   . Heart disease Maternal Aunt     Social History   Tobacco Use  . Smoking status: Former Smoker    Quit date: 05/09/1997    Years since quitting: 23.0  . Smokeless tobacco: Never Used  Substance Use Topics  . Alcohol use: No  . Drug use: No    Home Medications Prior to Admission medications   Medication Sig Start Date End Date Taking? Authorizing Provider  amoxicillin-clavulanate (AUGMENTIN) 875-125 MG tablet Take 1 tablet by mouth every 12 (twelve) hours. 05/13/20  Yes Miracle Criado, Gwenyth Allegra, MD  oxyCODONE-acetaminophen (PERCOCET) 5-325 MG tablet Take 1 tablet by mouth every 4 (four) hours as needed. 05/13/20  Yes Emile Kyllo, Gwenyth Allegra, MD  ALPRAZolam Duanne Moron) 0.25 MG tablet Take 0.25 mg by mouth at bedtime. For sleep    [provider]  Calcium Carbonate-Vitamin D (CALTRATE 600+D PO) Take 1 tablet by mouth daily.     [provider]  cetirizine (ZYRTEC) 10 MG tablet Take 10 mg by mouth daily as needed for allergies.     [provider]  escitalopram (LEXAPRO) 20 MG tablet Take 20 mg by mouth daily.    [provider]  fish oil-omega-3 fatty acids 1000 MG capsule Take 1 g by mouth 2 (two) times daily.     [provider]  lisinopril (PRINIVIL,ZESTRIL) 20 MG tablet Take 1 tablet (20 mg total) by mouth daily. 06/05/14   Belva Crome, MD  Multiple Vitamin (MULTIVITAMIN WITH MINERALS) TABS Take 1  tablet by mouth daily.    [provider]  ondansetron (ZOFRAN) 4 MG tablet Take 1 tablet (4 mg total) by mouth every 4 (four) hours as needed for nausea or vomiting. 11/07/15   Jackolyn Confer, MD  oxyCODONE (OXY IR/ROXICODONE) 5 MG immediate release tablet Take 1-2 tablets (5-10 mg total) by mouth every 4 (four) hours as needed for moderate pain, severe pain or breakthrough pain. 11/07/15   Jackolyn Confer, MD  Red Yeast Rice 600 MG CAPS Take 1,200 mg by mouth 2 (two) times daily.    [provider]  rosuvastatin (CRESTOR) 5 MG tablet Take 10 mg by mouth 3 (three) times a week.     [provider]  traZODone (DESYREL) 50 MG tablet Take 50 mg by mouth at bedtime.    [provider]    Allergies    Codeine  Review of Systems   Review of Systems  Gastrointestinal: Positive for abdominal pain.  All other systems reviewed and are negative.   Physical Exam Updated Vital Signs BP (!) 160/72   Pulse (!) 48   Temp 98.4 F (36.9 C) (Oral)   Resp 11   Ht 5\' 1"  (1.549 m)   Wt 70.3 kg   SpO2 96%   BMI 29.29 kg/m   Physical Exam Vitals and nursing note reviewed.  Constitutional:      General: She is not in acute distress.    Appearance: Normal appearance. She is well-developed.  HENT:     Head: Normocephalic and atraumatic.     Right Ear: Hearing normal.     Left Ear: Hearing normal.     Nose: Nose normal.  Eyes:     Conjunctiva/sclera: Conjunctivae normal.     Pupils: Pupils are equal, round, and reactive to light.  Cardiovascular:     Rate and Rhythm: Regular rhythm.     Heart sounds: S1 normal and S2 normal. No murmur heard. No friction rub. No gallop.   Pulmonary:     Effort: Pulmonary effort is normal. No respiratory distress.     Breath sounds: Normal breath sounds.  Chest:     Chest wall: No tenderness.  Abdominal:     General: Bowel sounds are normal.     Palpations: Abdomen is soft.     Tenderness: There is abdominal tenderness  in the right lower quadrant and left lower quadrant. There is no guarding or rebound. Negative signs include Murphy's sign and McBurney's sign.     Hernia: No hernia is present.  Musculoskeletal:  General: Normal range of motion.     Cervical back: Normal range of motion and neck supple.  Skin:    General: Skin is warm and dry.     Findings: No rash.  Neurological:     Mental Status: She is alert and oriented to person, place, and time.     GCS: GCS eye subscore is 4. GCS verbal subscore is 5. GCS motor subscore is 6.     Cranial Nerves: No cranial nerve deficit.     Sensory: No sensory deficit.     Coordination: Coordination normal.  Psychiatric:        Speech: Speech normal.        Behavior: Behavior normal.        Thought Content: Thought content normal.     ED Results / Procedures / Treatments   Labs (all labs ordered are listed, but only abnormal results are displayed) Labs Reviewed  COMPREHENSIVE METABOLIC PANEL - Abnormal; Notable for the following components:      Result Value   Total Bilirubin 1.7 (*)    All other components within normal limits  URINALYSIS, ROUTINE W REFLEX MICROSCOPIC - Abnormal; Notable for the following components:   Specific Gravity, Urine >1.046 (*)    All other components within normal limits  URINE CULTURE  CBC WITH DIFFERENTIAL/PLATELET  LIPASE, BLOOD    EKG None  Radiology CT ABDOMEN PELVIS W CONTRAST  Result Date: 05/12/2020 CLINICAL DATA:  Abdominal pain, fever EXAM: CT ABDOMEN AND PELVIS WITH CONTRAST TECHNIQUE: Multidetector CT imaging of the abdomen and pelvis was performed using the standard protocol following bolus administration of intravenous contrast. CONTRAST:  131mL OMNIPAQUE IOHEXOL 300 MG/ML  SOLN COMPARISON:  12/08/2006 FINDINGS: Lower chest: Lung bases are clear. No effusions. Heart is normal size. Hepatobiliary: Prior cholecystectomy. Diffuse fatty infiltration of the liver. No focal hepatic abnormality. Pancreas: No  focal abnormality or ductal dilatation. Spleen: No focal abnormality.  Normal size. Adrenals/Urinary Tract: No adrenal abnormality. No focal renal abnormality. No stones or hydronephrosis. Urinary bladder is unremarkable. Stomach/Bowel: Sigmoid diverticulosis. Inflammatory stranding around the mid sigmoid colon compatible with active diverticulitis. Prior cholecystic rash that prior appendectomy. Stomach and small bowel decompressed. Vascular/Lymphatic: Aortic atherosclerosis. No evidence of aneurysm or adenopathy. Reproductive: Uterus and adnexa unremarkable.  No mass. Other: No free fluid or free air. Musculoskeletal: No acute bony abnormality. IMPRESSION: Sigmoid diverticulosis with inflammatory stranding around the mid sigmoid colon compatible with active diverticulitis. Hepatic steatosis. Aortic atherosclerosis. Electronically Signed   By: Rolm Baptise M.D.   On: 05/12/2020 19:05    Procedures Procedures   Medications Ordered in ED Medications  iohexol (OMNIPAQUE) 300 MG/ML solution 100 mL (has no administration in time range)  Ampicillin-Sulbactam (UNASYN) 3 g in sodium chloride 0.9 % 100 mL IVPB (has no administration in time range)  iohexol (OMNIPAQUE) 300 MG/ML solution 100 mL (100 mLs Intravenous Contrast Given 05/12/20 1829)    ED Course  I have reviewed the triage vital signs and the nursing notes.  Pertinent labs & imaging results that were available during my care of the patient were reviewed by me and considered in my medical decision making (see chart for details).    MDM Rules/Calculators/A&P                          Presents to the emergency department for evaluation of lower abdominal pain that has been ongoing for 2 days.  Patient with tenderness but no guarding  or rebound.  Blood work is unremarkable.  CT scan shows uncomplicated diverticulitis.  Administered Unasyn here and will discharge on Augmentin, analgesia.  She will follow up with her gastroenterologist.  Was given  return precautions.  Final Clinical Impression(s) / ED Diagnoses Final diagnoses:  Diverticulitis    Rx / DC Orders ED Discharge Orders         Ordered    amoxicillin-clavulanate (AUGMENTIN) 875-125 MG tablet  Every 12 hours        05/13/20 0026    oxyCODONE-acetaminophen (PERCOCET) 5-325 MG tablet  Every 4 hours PRN        05/13/20 0026           Orpah Greek, MD 05/13/20 [phone]

## 2020-05-15 LAB — URINE CULTURE

## 2020-06-18 ENCOUNTER — Other Ambulatory Visit: Payer: Self-pay | Admitting: Gastroenterology

## 2020-06-18 ENCOUNTER — Ambulatory Visit
Admission: RE | Admit: 2020-06-18 | Discharge: 2020-06-18 | Disposition: A | Discharge status: Home / Self Care | Payer: Medicare Other | Source: Ambulatory Visit | Attending: Gastroenterology | Admitting: Gastroenterology

## 2020-06-18 ENCOUNTER — Other Ambulatory Visit: Payer: Self-pay | Admitting: Family Medicine

## 2020-06-18 DIAGNOSIS — R197 Diarrhea, unspecified: Secondary | ICD-10-CM

## 2020-06-18 DIAGNOSIS — R109 Unspecified abdominal pain: Secondary | ICD-10-CM

## 2020-10-18 ENCOUNTER — Other Ambulatory Visit: Payer: Self-pay | Admitting: Gastroenterology

## 2020-11-13 ENCOUNTER — Encounter (HOSPITAL_COMMUNITY): Payer: Self-pay | Admitting: Gastroenterology

## 2020-11-13 NOTE — Progress Notes (Signed)
Attempted to obtain medical history via telephone, unable to reach at this time. I left a voicemail to return pre surgical testing department's phone call.  

## 2020-11-22 ENCOUNTER — Encounter (HOSPITAL_COMMUNITY)
Admission: RE | Disposition: A | Discharge status: Home / Self Care | Payer: Self-pay | Source: Home / Self Care | Attending: Gastroenterology

## 2020-11-22 ENCOUNTER — Ambulatory Visit (HOSPITAL_COMMUNITY): Payer: Medicare Other | Admitting: Anesthesiology

## 2020-11-22 ENCOUNTER — Encounter (HOSPITAL_COMMUNITY): Payer: Self-pay | Admitting: Gastroenterology

## 2020-11-22 ENCOUNTER — Other Ambulatory Visit: Payer: Self-pay

## 2020-11-22 ENCOUNTER — Ambulatory Visit (HOSPITAL_COMMUNITY)
Admission: RE | Admit: 2020-11-22 | Discharge: 2020-11-22 | Disposition: A | Discharge status: Home / Self Care | Payer: Medicare Other | Attending: Gastroenterology | Admitting: Gastroenterology

## 2020-11-22 DIAGNOSIS — R001 Bradycardia, unspecified: Secondary | ICD-10-CM | POA: Diagnosis not present

## 2020-11-22 DIAGNOSIS — R194 Change in bowel habit: Secondary | ICD-10-CM | POA: Insufficient documentation

## 2020-11-22 DIAGNOSIS — K5730 Diverticulosis of large intestine without perforation or abscess without bleeding: Secondary | ICD-10-CM | POA: Insufficient documentation

## 2020-11-22 DIAGNOSIS — K6389 Other specified diseases of intestine: Secondary | ICD-10-CM | POA: Insufficient documentation

## 2020-11-22 DIAGNOSIS — Z8249 Family history of ischemic heart disease and other diseases of the circulatory system: Secondary | ICD-10-CM | POA: Diagnosis not present

## 2020-11-22 DIAGNOSIS — Z87891 Personal history of nicotine dependence: Secondary | ICD-10-CM | POA: Insufficient documentation

## 2020-11-22 DIAGNOSIS — K648 Other hemorrhoids: Secondary | ICD-10-CM | POA: Diagnosis not present

## 2020-11-22 DIAGNOSIS — D125 Benign neoplasm of sigmoid colon: Secondary | ICD-10-CM | POA: Insufficient documentation

## 2020-11-22 DIAGNOSIS — R159 Full incontinence of feces: Secondary | ICD-10-CM | POA: Diagnosis not present

## 2020-11-22 DIAGNOSIS — D120 Benign neoplasm of cecum: Secondary | ICD-10-CM | POA: Insufficient documentation

## 2020-11-22 HISTORY — PX: COLONOSCOPY WITH PROPOFOL: SHX5780

## 2020-11-22 HISTORY — PX: BIOPSY: SHX5522

## 2020-11-22 HISTORY — PX: POLYPECTOMY: SHX5525

## 2020-11-22 SURGERY — COLONOSCOPY WITH PROPOFOL
Anesthesia: Monitor Anesthesia Care

## 2020-11-22 MED ORDER — PROPOFOL 500 MG/50ML IV EMUL
INTRAVENOUS | Status: DC | PRN
  Administered 2020-11-22: 125 ug/kg/min via INTRAVENOUS

## 2020-11-22 MED ORDER — ATROPINE SULFATE 0.4 MG/ML IV SOLN
INTRAVENOUS | Status: DC | PRN
  Administered 2020-11-22: .4 mg via INTRAVENOUS

## 2020-11-22 MED ORDER — PROPOFOL 10 MG/ML IV BOLUS
INTRAVENOUS | Status: DC | PRN
  Administered 2020-11-22: 30 mg via INTRAVENOUS
  Administered 2020-11-22: 20 mg via INTRAVENOUS

## 2020-11-22 MED ORDER — ATROPINE SULFATE 0.4 MG/ML IV SOLN
INTRAVENOUS | Status: AC
  Filled 2020-11-22: qty 1

## 2020-11-22 MED ORDER — PROPOFOL 500 MG/50ML IV EMUL
INTRAVENOUS | Status: AC
  Filled 2020-11-22: qty 50

## 2020-11-22 MED ORDER — GLYCOPYRROLATE PF 0.2 MG/ML IJ SOSY
PREFILLED_SYRINGE | INTRAMUSCULAR | Status: DC | PRN
  Administered 2020-11-22: .2 mg via INTRAVENOUS

## 2020-11-22 MED ORDER — LACTATED RINGERS IV SOLN
INTRAVENOUS | Status: DC
  Administered 2020-11-22: 1000 mL via INTRAVENOUS

## 2020-11-22 SURGICAL SUPPLY — 21 items

## 2020-11-22 NOTE — Anesthesia Procedure Notes (Signed)
Procedure Name: MAC Date/Time: 11/22/2020 9:31 AM Performed by: Lollie Sails, CRNA Pre-anesthesia Checklist: Patient identified, Emergency Drugs available, Suction available, Patient being monitored and Timeout performed Oxygen Delivery Method: Simple face mask Placement Confirmation: positive ETCO2

## 2020-11-22 NOTE — Op Note (Addendum)
[facility] Patient Name: [name] Riley Procedure Date: 11/22/2020 MRN: [id number] Attending MD: Otis Brace , MD Date of Birth: Jul 28, 1950 CSN: [id number] Age: 70 Admit Type: Outpatient Procedure:                Colonoscopy Indications:              Last colonoscopy: 2018, Change in bowel habits,                            Fecal incontinence Providers:                Otis Brace, MD, Dulcy Fanny, Frazier Richards, Technician, Cletis Athens, Technician Referring MD:              Medicines:                Sedation Administered by an Anesthesia Professional Complications:            No immediate complications. Estimated Blood Loss:     Estimated blood loss was minimal. Procedure:                Pre-Anesthesia Assessment:                           - Prior to the procedure, a History and Physical                            was performed, and patient medications and                            allergies were reviewed. The patient's tolerance of                            previous anesthesia was also reviewed. The risks                            and benefits of the procedure and the sedation                            options and risks were discussed with the patient.                            All questions were answered, and informed consent                            was obtained. Prior Anticoagulants: The patient has                            taken no previous anticoagulant or antiplatelet                            agents. ASA Grade Assessment: III - A patient with  severe systemic disease. After reviewing the risks                            and benefits, the patient was deemed in                            satisfactory condition to undergo the procedure.                           After obtaining informed consent, the colonoscope                            was passed under direct vision. Throughout the                             procedure, the patient's blood pressure, pulse, and                            oxygen saturations were monitored continuously. The                            [id number]-HQ190L ([id number]) Olympus colonoscope was                            introduced through the anus and advanced to the the                            cecum, identified by appendiceal orifice and                            ileocecal valve. The colonoscopy was technically                            difficult and complex due to significant looping.                            Successful completion of the procedure was aided by                            applying abdominal pressure. The patient tolerated                            the procedure well. The quality of the bowel                            preparation was inadequate. Scope In: 9:34:53 AM Scope Out: 10:07:03 AM Scope Withdrawal Time: 0 hours 21 minutes 30 seconds  Total Procedure Duration: 0 hours 32 minutes 10 seconds  Findings:      The perianal and digital rectal examinations were normal.      The colon (entire examined portion) revealed moderately excessive       looping. Advancing the scope required applying abdominal pressure. Not       able to intubate TI.      A large amount of liquid semi-liquid  stool was found in the entire       colon, interfering with visualization. Lavage of the area was performed       using a large amount, resulting in incomplete clearance with fair       visualization.      A 10 mm polyp was found in the cecum. The polyp was sessile. The polyp       was removed with a hot snare. Resection and retrieval were complete.      Normal mucosa was found in the entire colon. Biopsies for histology were       taken with a cold forceps from the ascending colon and descending colon       for evaluation of microscopic colitis.      A 6 mm polyp was found in the sigmoid colon. The polyp was sessile. The       polyp was removed with a  cold snare. Resection and retrieval were       complete.      Multiple small and large-mouthed diverticula were found in the sigmoid       colon. Peri-diverticular erythema was seen.      Internal hemorrhoids were found during retroflexion. The hemorrhoids       were small. Impression:               - Preparation of the colon was inadequate.                           - There was significant looping of the colon.                           - Stool in the entire examined colon.                           - One 10 mm polyp in the cecum, removed with a hot                            snare. Resected and retrieved.                           - Normal mucosa in the entire examined colon.                            Biopsied.                           - One 6 mm polyp in the sigmoid colon, removed with                            a cold snare. Resected and retrieved.                           - Mild diverticulosis in the sigmoid colon.                            Peri-diverticular erythema was seen.                           -  Internal hemorrhoids. Moderate Sedation:      Moderate (conscious) sedation was personally administered by an       anesthesia professional. The following parameters were monitored: oxygen       saturation, heart rate, blood pressure, and response to care. Recommendation:           - Patient has a contact number available for                            emergencies. The signs and symptoms of potential                            delayed complications were discussed with the                            patient. Return to normal activities tomorrow.                            Written discharge instructions were provided to the                            patient.                           - Resume previous diet.                           - Continue present medications.                           - Await pathology results.                           - Repeat colonoscopy date to be  determined after                            pending pathology results are reviewed for                            surveillance based on pathology results.                           - Return to GI office as previously scheduled. Procedure Code(s):        --- Professional ---                           [phone], Colonoscopy, flexible; with removal of                            tumor(s), polyp(s), or other lesion(s) by snare                            technique                           48016, 26, Colonoscopy, flexible; with biopsy,  single or multiple Diagnosis Code(s):        --- Professional ---                           K64.8, Other hemorrhoids                           K63.5, Polyp of colon                           R19.4, Change in bowel habit                           R15.9, Full incontinence of feces                           K57.30, Diverticulosis of large intestine without                            perforation or abscess without bleeding CPT copyright 2019 American [employer]. All rights reserved. The codes documented in this report are preliminary and upon coder review may  be revised to meet current compliance requirements. Otis Brace, MD Otis Brace, MD 11/22/2020 10:17:28 AM Number of Addenda: 0

## 2020-11-22 NOTE — Anesthesia Preprocedure Evaluation (Signed)
Anesthesia Evaluation  Patient identified by MRN, date of birth, ID band Patient awake    Reviewed: Allergy & Precautions, NPO status , Patient's Chart, lab work & pertinent test results  History of Anesthesia Complications (+) PONV  Airway Mallampati: II  TM Distance: >3 FB Neck ROM: Full    Dental no notable dental hx.    Pulmonary neg pulmonary ROS, former smoker,    Pulmonary exam normal breath sounds clear to auscultation       Cardiovascular hypertension, Pt. on medications Normal cardiovascular exam Rhythm:Regular Rate:Normal     Neuro/Psych Anxiety negative neurological ROS     GI/Hepatic negative GI ROS, Neg liver ROS,   Endo/Other  negative endocrine ROS  Renal/GU negative Renal ROS  negative genitourinary   Musculoskeletal negative musculoskeletal ROS (+)   Abdominal   Peds negative pediatric ROS (+)  Hematology negative hematology ROS (+)   Anesthesia Other Findings   Reproductive/Obstetrics negative OB ROS                             Anesthesia Physical Anesthesia Plan  ASA: 2  Anesthesia Plan: MAC   Post-op Pain Management:    Induction: Intravenous  PONV Risk Score and Plan: 3 and Propofol infusion and Treatment may vary due to age or medical condition  Airway Management Planned: Simple Face Mask  Additional Equipment:   Intra-op Plan:   Post-operative Plan:   Informed Consent: I have reviewed the patients History and Physical, chart, labs and discussed the procedure including the risks, benefits and alternatives for the proposed anesthesia with the patient or authorized representative who has indicated his/her understanding and acceptance.     Dental advisory given  Plan Discussed with: CRNA and Surgeon  Anesthesia Plan Comments:         Anesthesia Quick Evaluation

## 2020-11-22 NOTE — Transfer of Care (Signed)
Immediate Anesthesia Transfer of Care Note  Patient: [name] Riley  Procedure(s) Performed: COLONOSCOPY WITH PROPOFOL POLYPECTOMY BIOPSY  Patient Location: PACU and Endoscopy Unit  Anesthesia Type:MAC  Level of Consciousness: awake, alert  and oriented  Airway & Oxygen Therapy: Patient Spontanous Breathing and Patient connected to face mask oxygen  Post-op Assessment: Report given to RN and Post -op Vital signs reviewed and stable  Post vital signs: Reviewed and stable  Last Vitals:  Vitals Value Taken Time  BP 180/80 11/22/20 1013  Temp    Pulse 68 11/22/20 1014  Resp 17 11/22/20 1014  SpO2 100 % 11/22/20 1014  Vitals shown include unvalidated device data.  Last Pain:  Vitals:   11/22/20 0844  TempSrc: Oral  PainSc: 0-No pain         Complications: No notable events documented.

## 2020-11-22 NOTE — Anesthesia Postprocedure Evaluation (Signed)
Anesthesia Post Note  Patient: [name] Riley  Procedure(s) Performed: COLONOSCOPY WITH PROPOFOL POLYPECTOMY BIOPSY     Patient location during evaluation: PACU Anesthesia Type: MAC Level of consciousness: awake and alert Pain management: pain level controlled Vital Signs Assessment: post-procedure vital signs reviewed and stable Respiratory status: spontaneous breathing, nonlabored ventilation, respiratory function stable and patient connected to nasal cannula oxygen Cardiovascular status: stable and blood pressure returned to baseline Postop Assessment: no apparent nausea or vomiting Anesthetic complications: no   No notable events documented.  Last Vitals:  Vitals:   11/22/20 1036 11/22/20 1037  BP:  (!) 163/71  Pulse: (!) 58 (!) 57  Resp: 17 15  Temp:    SpO2: 93% 93%    Last Pain:  Vitals:   11/22/20 1015  TempSrc: Oral  PainSc:                  [name]

## 2020-11-22 NOTE — H&P (Signed)
Primary Care Physician:  Carol Ada, MD Primary Gastroenterologist:  [facility]  Reason for Visit : Fecal urgency  HPI: [name] Riley is a 70 y.o. female here for outpatient colonoscopy for fecal urgency and change in bowel habits.  She was initially diagnosed with sigmoid diverticulitis on May 12, 2020.  She was treated with antibiotics and then developed C. difficile infection.  C. difficile was treated with vancomycin.  She was then started having constipation with some fecal incontinence. Abdominal x-ray on Jun 18, 2020 showed stool throughout the colon otherwise no acute changes.         C. difficile stool study in GI pathogen panel 08/30/20 were negative. recommended one tablespoon of MiraLAX daily and taking fiber daily.          patient states she cannot take miralax. causes pure diarrhea. Took benefiber. feels like benefiber didn't make a difference. Has soft, formed stools sometimes. No straining. sometimes will have some mushy stool.                last colonoscopy 10/15. Diverticulosis, multiple hyperplastic polyps ranging from 3 mm to 8 mm, benign mild stenosis of sigmoid colon as well as diverticulosis.. Repeat 10 years.  Past Medical History:  Diagnosis Date   Allergy    Anemia    hx of   Anxiety    Bradycardia    Bronchitis    hx of   Chronic back pain    Complication of anesthesia    "hard to wake up" and has bradycardia   Depression    Diverticulosis    DJD (degenerative joint disease)    Hx of colonoscopy 12/2003   Hyperlipidemia    Hypertension    Insomnia    PONV (postoperative nausea and vomiting)    Symptomatic cholelithiasis     Past Surgical History:  Procedure Laterality Date   APPENDECTOMY     btl     CHOLECYSTECTOMY N/A 11/07/2015   Procedure: LAPAROSCOPIC CHOLECYSTECTOMY WITH INTRAOPERATIVE CHOLANGIOGRAM AND X-RAY BILE DUCT;  Surgeon: Jackolyn Confer, MD;  Location: [facility];  Service: General;  Laterality: N/A;   COLONOSCOPY     HERNIA  REPAIR     INSERTION OF MESH  12/17/2011   Procedure: INSERTION OF MESH;  Surgeon: Imogene Burn. Georgette Dover, MD;  Location: [facility];  Service: General;  Laterality: N/A;   KNEE SURGERY     right knee   neck discectomy     TUBAL LIGATION     VENTRAL HERNIA REPAIR  12/17/2011   Procedure: HERNIA REPAIR VENTRAL ADULT;  Surgeon: Imogene Burn. Georgette Dover, MD;  Location: [facility];  Service: General;  Laterality: N/A;    Prior to Admission medications   Medication Sig Start Date End Date Taking? Authorizing Provider  ALPRAZolam (XANAX) 0.25 MG tablet Take 0.25 mg by mouth at bedtime. For sleep   Yes [provider]  Calcium Carbonate-Vitamin D (CALTRATE 600+D PO) Take 1 tablet by mouth daily.    Yes [provider]  cetirizine (ZYRTEC) 10 MG tablet Take 10 mg by mouth daily as needed for allergies.    Yes [provider]  escitalopram (LEXAPRO) 20 MG tablet Take 20 mg by mouth daily.   Yes [provider]  fish oil-omega-3 fatty acids 1000 MG capsule Take 1 g by mouth 2 (two) times daily.    Yes [provider]  lisinopril (PRINIVIL,ZESTRIL) 20 MG tablet Take 1 tablet (20 mg total) by mouth daily. 06/05/14  Yes Daneen Schick  W, MD  Multiple Vitamin (MULTIVITAMIN WITH MINERALS) TABS Take 1 tablet by mouth daily.   Yes [provider]  rosuvastatin (CRESTOR) 5 MG tablet Take 5 mg by mouth 3 (three) times a week.   Yes [provider]  traZODone (DESYREL) 50 MG tablet Take 50 mg by mouth at bedtime.   Yes [provider]  Turmeric 500 MG CAPS Take 500 mg by mouth daily.   Yes [provider]  amoxicillin-clavulanate (AUGMENTIN) 875-125 MG tablet Take 1 tablet by mouth every 12 (twelve) hours. Patient not taking: No sig reported 05/13/20   Orpah Greek, MD    Scheduled Meds: Continuous Infusions:  lactated ringers     PRN Meds:.  Allergies as of 10/18/2020 - Review Complete 11/07/2015  Allergen Reaction Noted   Codeine Nausea  Only 10/22/2011    Family History  Problem Relation Age of Onset   Heart disease Mother    Hypertension Mother    Heart attack Mother        x2   Stroke Mother    Heart disease Maternal Aunt     Social History   Socioeconomic History   Marital status: Divorced    Spouse name: Not on file   Number of children: Not on file   Years of education: Not on file   Highest education level: Not on file  Occupational History   Not on file  Tobacco Use   Smoking status: Former    Types: Cigarettes    Quit date: 05/09/1997    Years since quitting: 23.5   Smokeless tobacco: Never  Substance and Sexual Activity   Alcohol use: No   Drug use: No   Sexual activity: Never  Other Topics Concern   Not on file  Social History Narrative   Not on file   Social Determinants of Health   Financial Resource Strain: Not on file  Food Insecurity: Not on file  Transportation Needs: Not on file  Physical Activity: Not on file  Stress: Not on file  Social Connections: Not on file  Intimate Partner Violence: Not on file    Review of Systems: All negative except as stated above in HPI.  Physical Exam: Vital signs: Vitals:   11/22/20 0844  BP: (!) 150/54  Pulse: (!) 53  Resp: 20  Temp: 98.1 F (36.7 C)  SpO2: 94%     General:   Alert,  Well-developed, well-nourished, pleasant and cooperative in NAD Lungs:  Clear throughout to auscultation.   No wheezes, crackles, or rhonchi. No acute distress. Heart: Bradycardia Abdomen: Soft, nontender, nondistended, bowel sounds present Rectal:  Deferred  GI:  Lab Results: No results for input(s): WBC, HGB, HCT, PLT in the last 72 hours. BMET No results for input(s): NA, K, CL, CO2, GLUCOSE, BUN, CREATININE, CALCIUM in the last 72 hours. LFT No results for input(s): PROT, ALBUMIN, AST, ALT, ALKPHOS, BILITOT, BILIDIR, IBILI in the last 72 hours. PT/INR No results for input(s): LABPROT, INR in the last 72 hours.   Studies/Results: No  results found.  Impression/Plan: -Fecal urgency -Recent diverticulitis in April 2022 -History of C. difficile in April 2022  Recommendations ------------------------- -Proceed with colonoscopy today  Risks (bleeding, infection, bowel perforation that could require surgery, sedation-related changes in cardiopulmonary systems), benefits (identification and possible treatment of source of symptoms, exclusion of certain causes of symptoms), and alternatives (watchful waiting, radiographic imaging studies, empiric medical treatment)  were explained to patient/family in detail and patient wishes to proceed.  LOS: 0 days   Otis Brace  MD, FACP 11/22/2020, 8:56 AM  Contact #  [phone]

## 2020-11-25 ENCOUNTER — Encounter (HOSPITAL_COMMUNITY): Payer: Self-pay | Admitting: Gastroenterology

## 2020-11-25 LAB — SURGICAL PATHOLOGY

## 2020-11-29 ENCOUNTER — Other Ambulatory Visit: Payer: Self-pay

## 2020-11-29 ENCOUNTER — Ambulatory Visit: Payer: Medicare Other | Attending: Gastroenterology | Admitting: Physical Therapy

## 2020-11-29 ENCOUNTER — Encounter: Payer: Self-pay | Admitting: Physical Therapy

## 2020-11-29 DIAGNOSIS — R279 Unspecified lack of coordination: Secondary | ICD-10-CM | POA: Diagnosis not present

## 2020-11-29 DIAGNOSIS — M6281 Muscle weakness (generalized): Secondary | ICD-10-CM | POA: Diagnosis present

## 2020-11-29 NOTE — Therapy (Signed)
[facility] @ [place], [place], [address] Phone: [phone]   Fax:  [phone]  Physical Therapy Evaluation  Patient Details  Name: [name] Riley MRN: [id number] Date of Birth: 12-Sep-1950 Referring Provider (PT): Garnette Scheuermann, [place]   Encounter Date: 11/29/2020   PT End of Session - 11/29/20 1104     Visit Number 1    Date for PT Re-Evaluation 03/01/21    Authorization Type [employer]    PT Start Time 1025    PT Stop Time 1101    PT Time Calculation (min) 46 min    Activity Tolerance Patient tolerated treatment well    Behavior During Therapy [facility] for tasks assessed/performed             Past Medical History:  Diagnosis Date   Allergy    Anemia    hx of   Anxiety    Bradycardia    Bronchitis    hx of   Chronic back pain    Complication of anesthesia    "hard to wake up" and has bradycardia   Depression    Diverticulosis    DJD (degenerative joint disease)    Hx of colonoscopy 12/2003   Hyperlipidemia    Hypertension    Insomnia    PONV (postoperative nausea and vomiting)    Symptomatic cholelithiasis     Past Surgical History:  Procedure Laterality Date   APPENDECTOMY     BIOPSY  11/22/2020   Procedure: BIOPSY;  Surgeon: Otis Brace, MD;  Location: [facility];  Service: Gastroenterology;;   btl     CHOLECYSTECTOMY N/A 11/07/2015   Procedure: LAPAROSCOPIC CHOLECYSTECTOMY WITH INTRAOPERATIVE CHOLANGIOGRAM AND X-RAY BILE DUCT;  Surgeon: Jackolyn Confer, MD;  Location: [facility];  Service: General;  Laterality: N/A;   COLONOSCOPY     COLONOSCOPY WITH PROPOFOL N/A 11/22/2020   Procedure: COLONOSCOPY WITH PROPOFOL;  Surgeon: Otis Brace, MD;  Location: [facility];  Service: Gastroenterology;  Laterality: N/A;   HERNIA REPAIR     INSERTION OF MESH  12/17/2011   Procedure: INSERTION OF MESH;  Surgeon: Imogene Burn. Georgette Dover, MD;  Location: [facility];  Service: General;  Laterality: N/A;    KNEE SURGERY     right knee   neck discectomy     POLYPECTOMY  11/22/2020   Procedure: POLYPECTOMY;  Surgeon: Otis Brace, MD;  [facility];  Service: Gastroenterology;;   TUBAL LIGATION     VENTRAL HERNIA REPAIR  12/17/2011   Procedure: HERNIA REPAIR VENTRAL ADULT;  Surgeon: Imogene Burn. Georgette Dover, MD;  Location: [facility];  Service: General;  Laterality: N/A;    There were no vitals filed for this visit.    Subjective Assessment - 11/29/20 1020     Subjective Pt reports fecal leakage since 12/21 had a diverticulitis 4/22 and has since had more leakage and incontinence. Pt reports she has had both full loss of bowels and episodes of smearing or leakage. Pt wears depends sometimes if she doesn't know where bathrooms will be. Pt reports she does have daily stool leakage and can have full loss of bowels up to 2 times per day ot 2 times per week and doesn't know when leaks but does know when she has a full loss of bowels but can't control it. Pt also reports she will urinate and has small amount of stool come out and not feel like she has to go. [place] Stool Scale usualy a type 6 with  knowing she needs to have BM and with leakage type 7 and when urinates type 5 sometimes.    How long can you sit comfortably? no limits    How long can you stand comfortably? no limits    How long can you walk comfortably? no limits    Patient Stated Goals to have less leage and more regular bowel movements    Currently in Pain? No/denies    Pain Score 0-No pain                [facility] PT Assessment - 11/29/20 0001       Assessment   Medical Diagnosis R15.9 (ICD-10-CM) - Full incontinence of feces    Referring Provider (PT) Leonie Green, [place] M, PA-C    Onset Date/Surgical Date --   12/21   Prior Therapy no      Precautions   Precautions None      Restrictions   Weight Bearing Restrictions No      Balance Screen   Has the patient fallen in the past 6 months No    Has the patient had a  decrease in activity level because of a fear of falling?  No    Is the patient reluctant to leave their home because of a fear of falling?  No      Home Ecologist residence    Living Arrangements Children;Other relatives      Prior Function   Level of Independence Independent    Vocation Retired    Leisure hike, walk, reading, cards, very active per pt      Cognition   Overall Cognitive Status Within Functional Limits for tasks assessed      Sensation   Light Touch Appears Intact      Coordination   Gross Motor Movements are Fluid and Coordinated Yes    Fine Motor Movements are Fluid and Coordinated Yes      Posture/Postural Control   Posture/Postural Control Postural limitations    Postural Limitations Rounded Shoulders;Posterior pelvic tilt      ROM / Strength   AROM / PROM / Strength AROM;Strength      AROM   Overall AROM Comments decreased thoracic and lumbar flexion, extension, rotation, and side bending by 25%      Strength   Overall Strength Comments bil hips grossly 4/5      Flexibility   Soft Tissue Assessment /Muscle Length yes      Palpation   Palpation comment no TTP per pt                       No emotional/communication barriers or cognitive limitation. Patient is motivated to learn. Patient understands and agrees with treatment goals and plan. PT explains patient will be examined in standing, sitting, and lying down to see how their muscles and joints work. When they are ready, they will be asked to remove their underwear so PT can examine their perineum. The patient is also given the option of providing their own chaperone as one is not provided in our facility. The patient also has the right and is explained the right to defer or refuse any part of the evaluation or treatment including the internal exam. With the patient's consent, PT will use one gloved finger to gently assess the muscles of the pelvic floor,  seeing how well it contracts and relaxes and if there is muscle symmetry. After, the patient will get dressed and  PT and patient will discuss exam findings and plan of care. PT and patient discuss plan of care, schedule, attendance policy and HEP activities.  Objective measurements completed on examination: See above findings.     Pelvic Floor Special Questions - 11/29/20 0001     Prior Pelvic/Prostate Exam No    Are you Pregnant or attempting pregnancy? No    Prior Pregnancies Yes    Number of Pregnancies 2    Number of Vaginal Deliveries 2    Episiotomy Performed Yes   with both   Currently Sexually Active No    History of sexually transmitted disease No    Marinoff Scale no problems    Urinary Leakage No    Urinary urgency Yes    Urinary frequency no    Fecal incontinence Yes   chronic history of constipation then leading to loose stools and fecal incontinence, diverticulosis and sometimes unable to tell when she leaks feces. Does not need to strain and pt reports she never has formed stools   Fluid intake not enough per day, 2 glasses per day    Caffeine beverages 1 cup per day, diet coke 1-2 per day    Falling out feeling (prolapse) No    Pelvic Floor Internal Exam patient identified and patient confirms consent for PT to perform internal soft tissue work and muscle strength and integrity assessment    Exam Type Rectal    Sensation WFL, pt denied any numbness or tingling    Palpation no TTP    Strength weak squeeze, no lift    Strength # of reps 5    Strength # of seconds 13    Tone decreased                       PT Education - 11/29/20 1103     Education Details Pt educated on exam findings, POC, voiding mechanics, breathing mechanics, constipation handouts given    Person(s) Educated Patient    Methods Explanation;Demonstration;Tactile cues;Verbal cues;Handout    Comprehension Verbalized understanding;Returned demonstration              PT Short Term  Goals - 11/29/20 1106       PT SHORT TERM GOAL #1   Title pt to be I with HEP    Time 6    Period Weeks    Status New    Target Date 01/10/21      PT SHORT TERM GOAL #2   Title pt to be I with recall of voiding and breathing mechanics for improved evacuation of stool    Time 5    Period Weeks    Status New    Target Date 01/10/21      PT SHORT TERM GOAL #3   Title pt to demonstrate at least 3/5 pelvic floor strength for decreased leakage symptoms    Time 6    Period Weeks    Status New    Target Date 01/10/21      PT SHORT TERM GOAL #4   Title pt to demonstrate at least 20s of isometric holds at rectum for decreased leakage symptoms    Time 6    Period Weeks    Status New    Target Date 01/10/21               PT Long Term Goals - 11/29/20 1108       PT LONG TERM GOAL #1   Title pt to  be I with advanced HEP    Time 3    Period Months    Status New    Target Date 03/01/21      PT LONG TERM GOAL #2   Title pt to demonstrate at least 4/5 pelvic floor strength for decreased leakage symptoms    Time 3    Period Months    Status New    Target Date 03/01/21      PT LONG TERM GOAL #3   Title pt to demonstrate at least 30s of isometric holds at rectum for decreased leakage symptoms    Time 3    Period Months    Status New    Target Date 03/01/21      PT LONG TERM GOAL #4   Title pt to report no more than one episode of leakage per month for improved symptom management and QOL    Time 3    Period Months    Status New    Target Date 03/01/21      PT LONG TERM GOAL #5   Title pt to demonstrate at least 5/5 hip strength globally for improved pelvic stability    Time 3    Period Months    Status New    Target Date 03/01/21                    Plan - 11/29/20 1104     Personal Factors and Comorbidities Age;Time since onset of injury/illness/exacerbation;Comorbidity 1    Comorbidities 2 vaginal births with episiotomies    Examination-Activity  Limitations Continence;Toileting;Hygiene/Grooming    Examination-Participation Restrictions Yard Work;Shop;Driving;Community Activity    Stability/Clinical Decision Making Evolving/Moderate complexity    Clinical Decision Making Moderate    Rehab Potential Good    PT Frequency 1x / week    PT Duration 12 weeks    PT Treatment/Interventions ADLs/Self Care Home Management;Functional mobility training;Therapeutic activities;Therapeutic exercise;Neuromuscular re-education;Manual techniques;Patient/family education;Taping;Scar mobilization;Passive range of motion;Energy conservation    PT Next Visit Plan go over handouts, coordination of breathing with all activities and increased isometrics    Consulted and Agree with Plan of Care Patient             Patient will benefit from skilled therapeutic intervention in order to improve the following deficits and impairments:  Decreased coordination, Decreased endurance, Impaired tone, Increased fascial restricitons, Decreased activity tolerance, Improper body mechanics, Impaired flexibility, Decreased scar mobility, Postural dysfunction, Decreased strength, Decreased mobility  Visit Diagnosis: Lack of coordination - Plan: PT plan of care cert/re-cert  Muscle weakness (generalized) - Plan: PT plan of care cert/re-cert     Problem List Patient Active Problem List   Diagnosis Date Noted   Symptomatic cholelithiasis 11/07/2015   Essential hypertension 03/01/2014   Bradycardia 02/28/2014   Morbid obesity ([place]) 02/28/2014   Hyperlipidemia 02/28/2014   Ventral hernia 11/03/2011  Stacy Gardner, PT, DPT 11/29/2209:12 AM   [facility] @ [place], [place], [address] Phone: [phone]   Fax:  [phone]  Name: [name] Riley MRN: [id number] Date of Birth: Nov 18, 1950

## 2020-11-29 NOTE — Patient Instructions (Signed)
About Constipation  Constipation Overview Constipation is the most common gastrointestinal complaint -- about 4 million Americans experience constipation and make 2.5 million physician visits a year to get help for the problem.  Constipation can occur when the colon absorbs too much water, the colon's muscle contraction is slow or sluggish, and/or there is delayed transit time through the colon.  The result is stool that is hard and dry.  Indicators of constipation include straining during bowel movements greater than 25% of the time, having fewer than three bowel movements per week, and/or the feeling of incomplete evacuation.  There are established guidelines ([place] II ) for defining constipation. A person needs to have two or more of the following symptoms for at least 12 weeks (not necessarily consecutive) in the preceding 12 months: Straining in  greater than 25% of bowel movements Lumpy or hard stools in greater than 25% of bowel movements Sensation of incomplete emptying in greater than 25% of bowel movements Sensation of anorectal obstruction/blockade in greater than 25% of bowel movements Manual maneuvers to help empty greater than 25% of bowel movements (e.g., digital evacuation, support of the pelvic floor)  Less than  3 bowel movements/week Loose stools are not present, and criteria for irritable bowel syndrome are insufficient Common Causes of Constipation lack of fiber in your diet lack of physical activity medications, including iron and calcium supplements  dairy intake dehydration abuse of laxatives travel Irritable Bowel Syndrome pregnancy luteal phase of menstruation (after ovulation and before menses) colorectal problems intestinal dysfunction  Treating Constipation  There are several ways of treating constipation, including changes to diet and exercise, use of laxatives, adjustments to the pelvic floor, and scheduled toileting.  These treatments include: increasing  fiber and fluids in the diet  increasing physical activity learning muscle coordination  learning proper toileting techniques and toileting modifications  designing and sticking  to a toileting schedule   A high fiber diet with plenty of fluids (up to 8 glasses of water daily) is suggested to relieve these symptoms.  Metamucil, 1 tablespoon once or twice daily can be used to keep bowels regular if needed.  Fiber Table -- Grams of Fiber in Food  For additional information on fiber content in foods, go to www.caloriecounts.com  Food Products Serving Size Grams of Fiber/serving  Breads    Whole Wheat 1 slice 5.99  White 1 slice 0.5  Rye 1 slice 3.57  Cereals    Oat Bran 1 oz. 4.06  Wheat Bran 1 oz. 10.0  All Bran  cup 6.0  Optimum 1 cup 10.0  Whole Wheat Total 1 cup 3.0  Fiber One   cup 13.0  Shredded Wheat 1oz. 2.64  Corn Flakes 1 oz. 0.45  Cheerio's 1 1/3 cup 2.0  Oatmeal 1 oz. 2.5  Rice    Brown  cup 5.27  White  cup 1.42  Spaghetti 2 oz. 2.56  Vegetables (cooked)    Broccoli  cup 2.58  Brussels sprouts  cup 2.0  Cauliflower  cup 2.6  Carrots  cup 3.2  Corn  cup 3.03  Eggplant  cup 0.96  Green peas  cup 3.36  Lettuce (raw)  cup 0.24  Baked potato w/skin  cup 2.97  Spinach  cup 2.07  Squash  cup 2.87  Tomato (raw)  cup 1.17  Zucchini  cup 1.26  Beans    Green (canned)  cup 1.89  Kidney  cup 5.48  Lima  cup 4.25  Pinto  cup 5.93  Fresh  fruits    Apple (with peel) 1 medium 2.76  Apricots 1 cup 3.13  Banana  1 medium 2.19  Black/Boysenberries 1 cup 7.2  Grapefruit 1 medium 3.61  Grapes 1 cup 1.12  Nectarine 1 medium 2.2  Orange 1 medium 3.14  Pear (with peel) 1 medium 4.32  Prunes 3 3.5  Raspberries 1 cup 7.5  Strawberries 1 cup 0.26  Watermelon 1 slice 3.78

## 2020-12-19 ENCOUNTER — Ambulatory Visit: Payer: Medicare Other | Attending: Gastroenterology | Admitting: Physical Therapy

## 2020-12-19 ENCOUNTER — Other Ambulatory Visit: Payer: Self-pay

## 2020-12-19 DIAGNOSIS — R293 Abnormal posture: Secondary | ICD-10-CM | POA: Insufficient documentation

## 2020-12-19 DIAGNOSIS — M6281 Muscle weakness (generalized): Secondary | ICD-10-CM | POA: Insufficient documentation

## 2020-12-19 NOTE — Therapy (Addendum)
[facility] [address] [place], [place], [address] Phone: [phone]   Fax:  [phone]  Physical Therapy Treatment  Patient Details  Name: [name] Riley MRN: [id number] Date of Birth: 27-Mar-1950 Referring Provider (PT): Garnette Scheuermann, [place]   Encounter Date: 12/19/2020   PT End of Session - 12/19/20 1200     Visit Number 2    Date for PT Re-Evaluation 03/01/21    Authorization Type [employer]    PT Start Time 1025    PT Stop Time 1205   pt denied additional needs to PT this session and wanted to discuss progress and make sure she was doing the correct things at home. Pt denied additional questions and additional treatment offered by PT.   PT Time Calculation (min) 20 min    Activity Tolerance Patient tolerated treatment well    Behavior During Therapy WFL for tasks assessed/performed             Past Medical History:  Diagnosis Date   Allergy    Anemia    hx of   Anxiety    Bradycardia    Bronchitis    hx of   Chronic back pain    Complication of anesthesia    "hard to wake up" and has bradycardia   Depression    Diverticulosis    DJD (degenerative joint disease)    Hx of colonoscopy 12/2003   Hyperlipidemia    Hypertension    Insomnia    PONV (postoperative nausea and vomiting)    Symptomatic cholelithiasis     Past Surgical History:  Procedure Laterality Date   APPENDECTOMY     BIOPSY  11/22/2020   Procedure: BIOPSY;  Surgeon: Otis Brace, MD;  Location: [facility];  Service: Gastroenterology;;   btl     CHOLECYSTECTOMY N/A 11/07/2015   Procedure: LAPAROSCOPIC CHOLECYSTECTOMY WITH INTRAOPERATIVE CHOLANGIOGRAM AND X-RAY BILE DUCT;  Surgeon: Jackolyn Confer, MD;  Location: [facility];  Service: General;  Laterality: N/A;   COLONOSCOPY     COLONOSCOPY WITH PROPOFOL N/A 11/22/2020   Procedure: COLONOSCOPY WITH PROPOFOL;  Surgeon: Otis Brace, MD;  Location: [facility];  Service:  Gastroenterology;  Laterality: N/A;   HERNIA REPAIR     INSERTION OF MESH  12/17/2011   Procedure: INSERTION OF MESH;  Surgeon: Imogene Burn. Georgette Dover, MD;  Location: [facility];  Service: General;  Laterality: N/A;   KNEE SURGERY     right knee   neck discectomy     POLYPECTOMY  11/22/2020   Procedure: POLYPECTOMY;  Surgeon: Otis Brace, MD;  Location: [facility];  Service: Gastroenterology;;   TUBAL LIGATION     VENTRAL HERNIA REPAIR  12/17/2011   Procedure: HERNIA REPAIR VENTRAL ADULT;  Surgeon: Imogene Burn. Georgette Dover, MD;  Location: [facility];  Service: General;  Laterality: N/A;    There were no vitals filed for this visit.   Subjective Assessment - 12/19/20 1148     Subjective Pt reports she is now having more formed stools since evaluation and started using Metamucil. Pt reports no fecal leakage since this and use of squatty potty.    How long can you sit comfortably? no limits    How long can you stand comfortably? no limits    Patient Stated Goals to have less leage and more regular bowel movements    Currently in Pain? No/denies  [facility] Adult PT Treatment/Exercise - 12/19/20 0001       Self-Care   Self-Care Other Self-Care Comments    Other Self-Care Comments  Pt educated on breathing mechanics for voiding, continued use of squatty potty and abdominal massage. Pt had questions about Metamucil use and educated if symptoms continue to be resolved she can try to scale back on use (she currently is only using it once per day at recommended servign size) to either every other day or 1/2 serving as tolerated to see if symptoms remain resolved. If return she may need to continue serving size. Pt also educated on fiber type with diet modifications as well to help with bulking stools and continue to prevent leakage.                       PT Short Term Goals - 11/29/20 1106       PT SHORT TERM GOAL #1   Title pt to be I with HEP     Time 6    Period Weeks    Status New    Target Date 01/10/21      PT SHORT TERM GOAL #2   Title pt to be I with recall of voiding and breathing mechanics for improved evacuation of stool    Time 5    Period Weeks    Status New    Target Date 01/10/21      PT SHORT TERM GOAL #3   Title pt to demonstrate at least 3/5 pelvic floor strength for decreased leakage symptoms    Time 6    Period Weeks    Status New    Target Date 01/10/21      PT SHORT TERM GOAL #4   Title pt to demonstrate at least 20s of isometric holds at rectum for decreased leakage symptoms    Time 6    Period Weeks    Status New    Target Date 01/10/21               PT Long Term Goals - 11/29/20 1108       PT LONG TERM GOAL #1   Title pt to be I with advanced HEP    Time 3    Period Months    Status New    Target Date 03/01/21      PT LONG TERM GOAL #2   Title pt to demonstrate at least 4/5 pelvic floor strength for decreased leakage symptoms    Time 3    Period Months    Status New    Target Date 03/01/21      PT LONG TERM GOAL #3   Title pt to demonstrate at least 30s of isometric holds at rectum for decreased leakage symptoms    Time 3    Period Months    Status New    Target Date 03/01/21      PT LONG TERM GOAL #4   Title pt to report no more than one episode of leakage per month for improved symptom management and QOL    Time 3    Period Months    Status New    Target Date 03/01/21      PT LONG TERM GOAL #5   Title pt to demonstrate at least 5/5 hip strength globally for improved pelvic stability    Time 3    Period Months    Status New    Target Date 03/01/21  Plan - 12/19/20 1202     Clinical Impression Statement Pt presents to clinic for first follow up appointment since evaluation. Pt is very pleased with progress and reports she has not had any leakage since day after evaluation as after eval, pt went to buy squatty potty and metamucil and  started with both that day. Pt reports she has had complete resolution of fecal leakage since this. Pt had questions regarding diet modifications and therapist recommendations moving foward. PT recommended to continue use of squatty potty and metamucil, also educated on diet modifications for fiber types to also improve stool bulking. Pt verbalized understanding and agreed to implement these as well. Pt no longer wearing pads at all. Pt also had questions about need of metamucil for length of time, pt currently only using once per day at serving size per bottle. Pt is agreeable to continuing use as long as it continues to help but educated on potentially weaning down sizing per day or use every other day or a couple days per week to remain regular and decreasing as long as symptoms don't return and pt agreed. Pt denied internal treatment, abdominal massage, or need of additional treatment today reporting she wanted to come in to discuss these questions and progress but was shocked at current improvement. Pt would like to keep one additional appointment for next month to insure no return of symptoms and if they remain resolved would like to be discharged. Pt requesting to end session early as all her needs met with education.    Personal Factors and Comorbidities Age;Time since onset of injury/illness/exacerbation;Comorbidity 1    Comorbidities 2 vaginal births with episiotomies    Examination-Activity Limitations Continence;Toileting;Hygiene/Grooming    Examination-Participation Restrictions Yard Work;Shop;Driving;Community Activity    Stability/Clinical Decision Making Evolving/Moderate complexity    Rehab Potential Good    PT Frequency 1x / week    PT Duration 12 weeks    PT Treatment/Interventions ADLs/Self Care Home Management;Functional mobility training;Therapeutic activities;Therapeutic exercise;Neuromuscular re-education;Manual techniques;Patient/family education;Taping;Scar mobilization;Passive  range of motion;Energy conservation    PT Next Visit Plan go over isometrics of pelvic floor contractions as needed.    Consulted and Agree with Plan of Care Patient             Patient will benefit from skilled therapeutic intervention in order to improve the following deficits and impairments:  Decreased coordination, Decreased endurance, Impaired tone, Increased fascial restricitons, Decreased activity tolerance, Improper body mechanics, Impaired flexibility, Decreased scar mobility, Postural dysfunction, Decreased strength, Decreased mobility  Visit Diagnosis: Muscle weakness (generalized)  Abnormal posture     Problem List Patient Active Problem List   Diagnosis Date Noted   Symptomatic cholelithiasis 11/07/2015   Essential hypertension 03/01/2014   Bradycardia 02/28/2014   Morbid obesity ([place]) 02/28/2014   Hyperlipidemia 02/28/2014   Ventral hernia 11/03/2011    Stacy Gardner, PT, DPT 12/20/2210:09 PM    PHYSICAL THERAPY DISCHARGE SUMMARY  Visits from Start of Care: 2  Current functional level related to goals / functional outcomes: Pt called to cancel remaining appointment due to symptoms resolving.   Remaining deficits: None per pt   Education / Equipment: HEP   Patient agrees to discharge. Patient goals were met. Patient is being discharged due to being pleased with the current functional level. Thank you for the referral.  Stacy Gardner, PT, DPT 01/08/2210:57 AM    [facility] @ [address] [place], [place], [address] Phone: [phone]   Fax:  [phone]  Name: LUPE HANDLEY MRN: [id number] Date of Birth: 08-05-1950

## 2020-12-26 ENCOUNTER — Encounter: Payer: Medicare Other | Admitting: Physical Therapy

## 2021-01-10 ENCOUNTER — Encounter: Payer: Medicare Other | Admitting: Physical Therapy

## 2021-01-24 ENCOUNTER — Encounter: Payer: Medicare Other | Admitting: Physical Therapy

## 2021-02-14 ENCOUNTER — Encounter: Payer: Medicare Other | Admitting: Physical Therapy

## 2021-02-28 ENCOUNTER — Encounter: Payer: Medicare Other | Admitting: Physical Therapy

## 2021-03-14 ENCOUNTER — Encounter: Payer: Medicare Other | Admitting: Physical Therapy

## 2021-03-28 ENCOUNTER — Encounter: Payer: Medicare Other | Admitting: Physical Therapy

## 2021-10-16 ENCOUNTER — Ambulatory Visit: Payer: Medicare Other | Admitting: Physician Assistant

## 2021-12-26 ENCOUNTER — Ambulatory Visit (INDEPENDENT_AMBULATORY_CARE_PROVIDER_SITE_OTHER): Payer: Medicare Other

## 2021-12-26 ENCOUNTER — Ambulatory Visit (INDEPENDENT_AMBULATORY_CARE_PROVIDER_SITE_OTHER): Payer: Medicare Other | Admitting: Podiatry

## 2021-12-26 DIAGNOSIS — M7661 Achilles tendinitis, right leg: Secondary | ICD-10-CM | POA: Diagnosis not present

## 2021-12-26 DIAGNOSIS — M722 Plantar fascial fibromatosis: Secondary | ICD-10-CM | POA: Diagnosis not present

## 2021-12-26 MED ORDER — TRIAMCINOLONE ACETONIDE 10 MG/ML IJ SUSP
10.0000 mg | Freq: Once | INTRAMUSCULAR | Status: AC
  Administered 2021-12-26: 10 mg

## 2021-12-26 NOTE — Patient Instructions (Signed)

## 2021-12-29 NOTE — Progress Notes (Signed)
Subjective:   Patient ID: [name] Riley, female   DOB: 71 y.o.   MRN: [id number]   HPI Patient presents with severe pain in the back of her right heel which is gotten very bad over the last few months.  States that she has trouble wearing shoes trouble walking and does not remember injury.  Patient had history of issues a number of years ago and does not smoke likes to be active if possible   Review of Systems  All other systems reviewed and are negative.       Objective:  Physical Exam Vitals and nursing note reviewed.  Constitutional:      Appearance: She is well-developed.  Pulmonary:     Effort: Pulmonary effort is normal.  Musculoskeletal:        General: Normal range of motion.  Skin:    General: Skin is warm.  Neurological:     Mental Status: She is alert.     Neurovascular status found to be intact muscle strength found to be adequate range of motion adequate moderate equinus condition noted.  Patient has inflammation pain of the insertion of the Achilles into the back of the right heel bone mostly on the medial side with no current lateral central involvement.  Patient has good digital perfusion well-oriented x3     Assessment:  Severe Achilles tendinitis with acute flareup right posterior heel medial side     Plan:  H&P reviewed condition and I do recommend careful injection after explaining chances for risk of rupture and she gives me permission for this and I went ahead and on the medial side I did sterile prep and injected 3 mg dexamethasone Kenalog 5 mg Xylocaine keep it away from the center and lateral side and then to both protect the tendon and to help solve her problem we fitted her and applied low air fracture walker right to immobilize the posterior muscle group and allow for healing to occur.  Patient will be seen back 4 weeks or earlier if necessary  X-rays indicate significant spur posterior aspect right heel

## 2022-01-23 ENCOUNTER — Ambulatory Visit (INDEPENDENT_AMBULATORY_CARE_PROVIDER_SITE_OTHER): Payer: Medicare Other | Admitting: Podiatry

## 2022-01-23 ENCOUNTER — Encounter: Payer: Self-pay | Admitting: Podiatry

## 2022-01-23 DIAGNOSIS — M7661 Achilles tendinitis, right leg: Secondary | ICD-10-CM | POA: Diagnosis not present

## 2022-01-23 DIAGNOSIS — L600 Ingrowing nail: Secondary | ICD-10-CM

## 2022-01-26 NOTE — Progress Notes (Signed)
Subjective:   Patient ID: [name] Riley, female   DOB: 71 y.o.   MRN: [id number]   HPI Patient states heel is doing much better with minimal discomfort and very satisfied that the pain has receded neuro   ROS      Objective:  Physical Exam  Vascular status intact with pain in the heel that is significantly better mild discomfort only upon deep palpation improved     Assessment:  Fasciitis symptomatology improved     Plan:  Reviewed condition recommended the continuation of stretching shoe gear modifications ice answered all questions and patient will be seen back as needed

## 2022-02-20 ENCOUNTER — Ambulatory Visit (INDEPENDENT_AMBULATORY_CARE_PROVIDER_SITE_OTHER): Payer: Medicare Other | Admitting: Podiatry

## 2022-02-20 ENCOUNTER — Encounter: Payer: Self-pay | Admitting: Podiatry

## 2022-02-20 DIAGNOSIS — L600 Ingrowing nail: Secondary | ICD-10-CM

## 2022-02-20 NOTE — Patient Instructions (Signed)

## 2022-02-25 NOTE — Progress Notes (Signed)
Subjective:   Patient ID: [name] Riley, female   DOB: 72 y.o.   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  Recommended removal of the borders and I explained procedures risk she wants surgery and after review she signed consent form.  Today I infiltrated each big toe 60 mg Xylocaine Marcaine mixture sterile prep done and using sterile instrumentation removed the hallux nail borders bilateral exposed matrix applied phenol 3 applications 30 seconds followed by alcohol lavage and sterile dressing gave instructions on soaks leave dressings on for 24 hours take them off earlier if throbbing were to occur and encouraged to call with any questions or concerns which may arise with the procedure

## 2022-05-01 ENCOUNTER — Ambulatory Visit (INDEPENDENT_AMBULATORY_CARE_PROVIDER_SITE_OTHER): Payer: Medicare Other | Admitting: Podiatry

## 2022-05-01 ENCOUNTER — Encounter: Payer: Self-pay | Admitting: Podiatry

## 2022-05-01 DIAGNOSIS — M7661 Achilles tendinitis, right leg: Secondary | ICD-10-CM | POA: Diagnosis not present

## 2022-05-02 NOTE — Progress Notes (Signed)
Subjective:   Patient ID: [name] Riley, female   DOB: 72 y.o.   MRN: [id number]   HPI Patient states she is still having pain in her right heel and its gotten worse and she only had a short period of relief   ROS      Objective:  Physical Exam  Neurovascular status intact with acute inflammation of the posterior right Achilles at insertion into the back of the heel that did well for about 4 weeks     Assessment:  Chronic Achilles tendinitis right that only responded for a period of time     Plan:  H&P reviewed and at this point I have recommended shockwave therapy with continued immobilization and I desperately would like to avoid surgery on her and this I think is the best option as I do not think any further cortisone injections are warranted.  Educated her on this and she is scheduled for procedure

## 2022-05-15 ENCOUNTER — Ambulatory Visit (INDEPENDENT_AMBULATORY_CARE_PROVIDER_SITE_OTHER): Payer: Medicare Other | Admitting: *Deleted

## 2022-05-15 DIAGNOSIS — M7661 Achilles tendinitis, right leg: Secondary | ICD-10-CM

## 2022-05-15 NOTE — Progress Notes (Signed)
Patient presents for the 1st EPAT treatment today with complaint of posterior heel pain right. Diagnosed with achilles tendon by Dr. Charlsie Merles. This has been ongoing for several months. The patient has tried ice, stretching, NSAIDS and supportive shoe gear with no long term relief.   Most of the pain is located posterior heel right.  ESWT administered and tolerated well.Treatment settings initiated at:   Energy: 12  Ended treatment session today with 3000 shocks at the following settings:   Energy: 12  Frequency: 5.0  Joules: 11.77   Reviewed post EPAT instructions. Advised to avoid ice and NSAIDs throughout the treatment process and to utilize boot or supportive shoes for at least the next 3 days.  Follow up for 2nd treatment in 1 week.

## 2022-05-15 NOTE — Patient Instructions (Signed)

## 2022-05-22 ENCOUNTER — Ambulatory Visit (INDEPENDENT_AMBULATORY_CARE_PROVIDER_SITE_OTHER): Payer: Medicare Other

## 2022-05-22 DIAGNOSIS — M7661 Achilles tendinitis, right leg: Secondary | ICD-10-CM

## 2022-05-22 DIAGNOSIS — M79673 Pain in unspecified foot: Secondary | ICD-10-CM

## 2022-05-22 NOTE — Patient Instructions (Signed)

## 2022-05-22 NOTE — Progress Notes (Signed)
Patient presents for the 2nd EPAT treatment today with complaint of posterior heel pain right  heel pain . Diagnosed with Achilles tendinitis by Dr. Charlsie Merles. This has been ongoing for several months. The patient has tried ice, stretching, NSAIDS and supportive shoe gear with no long term relief.   Most of the pain is located back of the heel .  ESWT administered and tolerated well.Treatment settings initiated at:   Energy: 20  Ended treatment session today with 3000 shocks at the following settings:   Energy: 20  Frequency: 5.0  Joules: 19.62   Reviewed post EPAT instructions. Advised to avoid ice and NSAIDs throughout the treatment process and to utilize boot or supportive shoes for at least the next 3 days.  Follow up for 3 treatment in 1 week.

## 2022-05-29 ENCOUNTER — Ambulatory Visit (INDEPENDENT_AMBULATORY_CARE_PROVIDER_SITE_OTHER): Payer: Medicare Other

## 2022-05-29 DIAGNOSIS — M7661 Achilles tendinitis, right leg: Secondary | ICD-10-CM

## 2022-05-29 NOTE — Progress Notes (Signed)
Patient presents for the 3rd EPAT treatment today with complaint of posterior heel pain right  heel pain . Diagnosed with Achilles tendinitis by Dr. Charlsie Merles. This has been ongoing for several months. The patient has tried ice, stretching, NSAIDS and supportive shoe gear with no long term relief.   Most of the pain is located back of the heel .  ESWT administered and tolerated well.Treatment settings initiated at:   Energy: 25  Ended treatment session today with 3000 shocks at the following settings:   Energy: 25  Frequency: 5.0  Joules: 24.52   Reviewed post EPAT instructions. Advised to avoid ice and NSAIDs throughout the treatment process and to utilize boot or supportive shoes for at least the next 3 days.  Follow up for 4th treatment in 4 weeks.

## 2022-06-26 ENCOUNTER — Ambulatory Visit (INDEPENDENT_AMBULATORY_CARE_PROVIDER_SITE_OTHER): Payer: Medicare Other

## 2022-06-26 DIAGNOSIS — M7661 Achilles tendinitis, right leg: Secondary | ICD-10-CM | POA: Insufficient documentation

## 2022-06-26 NOTE — Progress Notes (Signed)
Patient presents for the 4th EPAT treatment today with complaint of posterior heel pain right  heel pain . Diagnosed with Achilles tendinitis by Dr. Charlsie Merles. This has been ongoing for several months. The patient has tried ice, stretching, NSAIDS and supportive shoe gear with no long term relief.   Most of the pain is located back of the heel .  ESWT administered and tolerated well.Treatment settings initiated at:   Energy: 25  Ended treatment session today with 3000 shocks at the following settings:   Energy: 25  Frequency: 5.0  Joules: 24.52   Reviewed post EPAT instructions. Advised to avoid ice and NSAIDs throughout the treatment process and to utilize boot or supportive shoes for at least the next 3 days.  Follow up 3 weeks with Dr. Charlsie Merles pt request.

## 2022-07-17 ENCOUNTER — Ambulatory Visit (INDEPENDENT_AMBULATORY_CARE_PROVIDER_SITE_OTHER): Payer: Medicare Other | Admitting: Podiatry

## 2022-07-17 ENCOUNTER — Encounter: Payer: Self-pay | Admitting: Podiatry

## 2022-07-17 DIAGNOSIS — M7661 Achilles tendinitis, right leg: Secondary | ICD-10-CM | POA: Diagnosis not present

## 2022-07-17 MED ORDER — TRIAMCINOLONE ACETONIDE 10 MG/ML IJ SUSP
10.0000 mg | Freq: Once | INTRAMUSCULAR | Status: AC
  Administered 2022-07-17: 10 mg

## 2022-07-19 NOTE — Progress Notes (Signed)
Subjective:   Patient ID: [name] Riley, female   DOB: 72 y.o.   MRN: [id number]   HPI Patient presents stating that the right foot back of the heel is improved some but feels like there is still 1 spot that is very bothersome to her on the posterior heel   ROS      Objective:  Physical Exam  Neurovascular status intact with inflammation around the posterior heel medial side with the center and lateral healthy     Assessment:  Appears to be 1 small area of inflammation still noted posterior heel     Plan:  H&P reviewed did careful sterile prep injected the medial area after explaining the chances for rupture which she understands 3 mg Dexasone Kenalog 5 mg Xylocaine keep it away from the center lateral wear boot at this time for several days utilize ice discussed the possibility for PRP injection or further treatment depending on response to this treatment but I am hoping since there is 1 small area left this will take care of it

## 2022-07-22 ENCOUNTER — Telehealth: Payer: Self-pay | Admitting: Podiatry

## 2022-07-22 ENCOUNTER — Other Ambulatory Visit: Payer: Self-pay | Admitting: Podiatry

## 2022-07-22 MED ORDER — DICLOFENAC SODIUM 75 MG PO TBEC: 75.0000 mg | DELAYED_RELEASE_TABLET | Freq: Two times a day (BID) | ORAL | 2 refills | Status: DC

## 2022-07-22 NOTE — Telephone Encounter (Signed)
"  I was in Friday morning to see Dr. Charlsie Merles.  He was calling in two prescriptions for me.  I called the pharmacy this morning and the have not received anything about a prescription.  This was [facility] on [address].  So, if you could let me know about the prescriptions."

## 2022-07-22 NOTE — Telephone Encounter (Signed)
Just one prescription. I called in

## 2022-08-21 ENCOUNTER — Ambulatory Visit (INDEPENDENT_AMBULATORY_CARE_PROVIDER_SITE_OTHER): Payer: Medicare Other | Admitting: Podiatry

## 2022-08-21 ENCOUNTER — Encounter: Payer: Self-pay | Admitting: Podiatry

## 2022-08-21 DIAGNOSIS — L600 Ingrowing nail: Secondary | ICD-10-CM | POA: Diagnosis not present

## 2022-08-21 DIAGNOSIS — M7661 Achilles tendinitis, right leg: Secondary | ICD-10-CM

## 2022-08-23 NOTE — Progress Notes (Signed)
Subjective:   Patient ID: [name] Riley, female   DOB: 72 y.o.   MRN: [id number]   HPI Patient states she is doing a lot better with minimal discomfort   ROS      Objective:  Physical Exam  Neurovascular status intact with patient found to have minimal pain with palpation to the posterior Achilles right with patient pretty much not wearing boot at this time     Assessment:  Achilles tendinitis right with inflammation that is continuing to improve     Plan:  H&P reviewed and at this point I did go ahead and I have recommended the continuation of stretch topical medicines heel left and patient is discharged will be seen back as needed

## 2023-01-28 ENCOUNTER — Other Ambulatory Visit: Payer: Self-pay | Admitting: Gastroenterology

## 2023-01-28 ENCOUNTER — Telehealth: Payer: Self-pay | Admitting: *Deleted

## 2023-01-28 NOTE — Telephone Encounter (Signed)
   Name: [name] Riley  DOB: 1950/11/21  MRN: [id number]  Primary Cardiologist: None  Chart reviewed as part of pre-operative protocol coverage. The patient has an upcoming visit scheduled with Dr. Val EagleJennette Kettle  on 02/23/2023 at which time clearance can be addressed in case there are any issues that would impact surgical recommendations.  I added preop FYI to appointment note so that provider is aware to address at time of outpatient visit.  Per office protocol the cardiology provider should forward their finalized clearance decision and recommendations regarding antiplatelet therapy to the requesting party below.    I will route this message as FYI to requesting party and remove this message from the preop box as separate preop APP input not needed at this time.   Please call with any questions.  Napoleon Form, Leodis Rains, NP  01/28/2023, 10:13 AM

## 2023-01-28 NOTE — Telephone Encounter (Signed)
   Pre-operative Risk Assessment    Patient Name: [name] Riley  DOB: September 30, 1950 MRN: [id number]      Request for Surgical Clearance    Procedure:   COLONOSCOPY  Date of Surgery:  Clearance 03/23/23                                 Surgeon:  DR. Levora Angel Surgeon's Group or Practice Name:  [facility] Phone number:  [phone] Fax number:  [phone]   Type of Clearance Requested:   - Medical    Type of Anesthesia:   PROPOFOL   Additional requests/questions:    SignedElliot Cousin   01/28/2023, 10:01 AM

## 2023-02-04 ENCOUNTER — Other Ambulatory Visit: Payer: Self-pay | Admitting: Family Medicine

## 2023-02-04 DIAGNOSIS — R7401 Elevation of levels of liver transaminase levels: Secondary | ICD-10-CM

## 2023-02-09 ENCOUNTER — Ambulatory Visit
Admission: RE | Admit: 2023-02-09 | Discharge: 2023-02-09 | Disposition: A | Discharge status: Home / Self Care | Payer: Medicare Other | Source: Ambulatory Visit | Attending: Family Medicine | Admitting: Family Medicine

## 2023-02-09 DIAGNOSIS — R7401 Elevation of levels of liver transaminase levels: Secondary | ICD-10-CM

## 2023-02-21 NOTE — Progress Notes (Signed)
 Cardiology Office Note:  .   Date:  02/23/2023  ID:  [name] Riley, DOB 1950/03/12, MRN [id number] PCP: Claudene Pellet, MD  [facility] Providers Cardiologist:  None { History of Present Illness: .   [name] Riley is a 73 y.o. female with history of CAD who presents for the evaluation of CAD at the request of Claudene Pellet, MD.  History of Present Illness   [name] Riley, a 73 year old female with a history of elevated coronary calcium score and hyperlipidemia, presents for evaluation. She has a history of hypertension and is on medication for it. She also has a history of hyperlipidemia and was on cholesterol medication, but stopped due to increased liver enzymes. She has been diagnosed with fatty liver through ultrasound. She denies any history of diabetes, heart attack, or stroke, but reports a strong family history of heart disease. She is a former smoker, having quit in 1999, and does not consume alcohol. She is retired and leads an active lifestyle. She denies any symptoms of angina or difficulty breathing. She is scheduled for a colonoscopy due to previous issues, but denies any current bleeding. She also reports a history of surgeries including gallbladder removal, hernia repair, and knee surgery. Strong family history of CAD in mother and maternal uncles.       Cr 0.88 T chol 234, HDL 49, LDL 159, TG 140     Problem List CAD -CAC 730 (92nd percentile) 2. HTN 3. HLD 4. Hepatic steatosis    ROS: All other ROS reviewed and negative. Pertinent positives noted in the HPI.     Studies Reviewed: SABRA   EKG Interpretation Date/Time:  Tuesday February 23 2023 08:03:10 EST Ventricular Rate:  54 PR Interval:  134 QRS Duration:  84 QT Interval:  428 QTC Calculation: 405 R Axis:   9  Text Interpretation: Sinus bradycardia Nonspecific T wave abnormality Confirmed by [name] Kotyk [phone]) on 02/23/2023 8:20:41 AM   Physical Exam:   VS:  BP 120/66 (BP Location: Left Arm, Patient  Position: Sitting, Cuff Size: Normal)   Pulse (!) 54   Ht 5' 2 (1.575 m)   Wt 154 lb (69.9 kg)   BMI 28.17 kg/m    Wt Readings from Last 3 Encounters:  02/23/23 154 lb (69.9 kg)  11/22/20 157 lb (71.2 kg)  05/12/20 155 lb (70.3 kg)    GEN: Well nourished, well developed in no acute distress NECK: No JVD; No carotid bruits CARDIAC: RRR, no murmurs, rubs, gallops RESPIRATORY:  Clear to auscultation without rales, wheezing or rhonchi  ABDOMEN: Soft, non-tender, non-distended EXTREMITIES:  No edema; No deformity  ASSESSMENT AND PLAN: .   Assessment and Plan    Coronary Artery Disease (CAD) Elevated coronary calcium score (730, 92nd percentile) with no symptoms of angina. Strong family history of heart disease. EKG shows normal sinus rhythm with nonspecific ST changes. -Order Coronary CTA to exclude obstructive CAD. BMP and LPa today. No need for beta blocker prior to CT scan.  -Order Echocardiogram. -Start Aspirin  81mg  daily. -Return visit in 1 year.  Hyperlipidemia LDL 159, unable to tolerate statins due to elevated liver enzymes. -Refer to pharmacy clinic for PCSK9 inhibitor therapy. -Check BMP and LPA today.  Fatty Liver Identified on ultrasound, no alcohol or medication use that could contribute. -No specific plan discussed.   HTN -well controlled on current meds   Preoperative Assessment for Colonoscopy No symptoms of angina, able to climb two flights of stairs. -can complete >4 METS without  limitations. May proceed to surgery at acceptable risk.  -Clearance given for colonoscopy at acceptable risk.              Follow-up: No follow-ups on file. Darryle T. [name], MD, [facility]  [facility]  [address] [place], [place] [address] [phone]  8:38 AM

## 2023-02-23 ENCOUNTER — Ambulatory Visit: Payer: Medicare Other | Attending: Cardiovascular Disease | Admitting: Cardiovascular Disease

## 2023-02-23 ENCOUNTER — Encounter: Payer: Self-pay | Admitting: Cardiovascular Disease

## 2023-02-23 VITALS — BP 120/66 | HR 54 | Ht 62.0 in | Wt 154.0 lb

## 2023-02-23 DIAGNOSIS — E782 Mixed hyperlipidemia: Secondary | ICD-10-CM | POA: Diagnosis not present

## 2023-02-23 DIAGNOSIS — R931 Abnormal findings on diagnostic imaging of heart and coronary circulation: Secondary | ICD-10-CM

## 2023-02-23 DIAGNOSIS — Z01810 Encounter for preprocedural cardiovascular examination: Secondary | ICD-10-CM | POA: Diagnosis not present

## 2023-02-23 DIAGNOSIS — I2510 Atherosclerotic heart disease of native coronary artery without angina pectoris: Secondary | ICD-10-CM | POA: Diagnosis not present

## 2023-02-23 DIAGNOSIS — Z01812 Encounter for preprocedural laboratory examination: Secondary | ICD-10-CM

## 2023-02-23 MED ORDER — METOPROLOL TARTRATE 25 MG PO TABS: ORAL_TABLET | ORAL | 0 refills | Status: DC

## 2023-02-23 MED ORDER — ASPIRIN 81 MG PO TBEC: 81.0000 mg | DELAYED_RELEASE_TABLET | Freq: Every day | ORAL | Status: AC

## 2023-02-23 NOTE — Patient Instructions (Addendum)
 Medication Instructions:   - Start 81mg  Aspirin  daily      Lab Work: BMET  Lpa    If you have labs (blood work) drawn today and your tests are completely normal, you will receive your results only by: MyChart Message (if you have MyChart) OR A paper copy in the mail If you have any lab test that is abnormal or we need to change your treatment, we will call you to review the results.   Testing/Procedures:   Your cardiac CT will be scheduled at one of the below locations:   [facility] [address] [place], [place] [address] [phone]   If scheduled at [facility], please arrive at the [facility] Entrance (Entrance C2) of [facility] 30 minutes prior to test start time. You can use the FREE valet parking offered at entrance C (encouraged to control the heart rate for the test)  Proceed to the [facility] Radiology Department (first floor) to check-in and test prep.  All radiology patients and guests should use entrance C2 at [facility], accessed from [facility], even though the hospital's physical address listed is [address].     Please follow these instructions carefully (unless otherwise directed):  An IV will be required for this test and Nitroglycerin  will be given.  Hold all erectile dysfunction medications at least 3 days (72 hrs) prior to test. (Ie viagra, cialis, sildenafil, tadalafil, etc)   On the Night Before the Test: Be sure to Drink plenty of water. Do not consume any caffeinated/decaffeinated beverages or chocolate 12 hours prior to your test. Do not take any antihistamines 12 hours prior to your test.  On the Day of the Test: Drink plenty of water until 1 hour prior to the test. Do not eat any food 1 hour prior to test. You may take your regular medications prior to the test.  Take metoprolol  (Lopressor ) two hours prior to test. If you take  Furosemide/Hydrochlorothiazide/Spironolactone/Chlorthalidone , please HOLD on the morning of the test. Patients who wear a continuous glucose monitor MUST remove the device prior to scanning. FEMALES- please wear underwire-free bra if available, avoid dresses & tight clothing        After the Test: Drink plenty of water. After receiving IV contrast, you may experience a mild flushed feeling. This is normal. On occasion, you may experience a mild rash up to 24 hours after the test. This is not dangerous. If this occurs, you can take Benadryl 25 mg and increase your fluid intake. If you experience trouble breathing, this can be serious. If it is severe call 911 IMMEDIATELY. If it is mild, please call our office.  We will call to schedule your test 2-4 weeks out understanding that some insurance companies will need an authorization prior to the service being performed.   For more information and frequently asked questions, please visit our website : [email or web address]  For non-scheduling related questions, please contact the cardiac imaging nurse navigator should you have any questions/concerns: Cardiac Imaging Nurse Navigators Direct Office Dial: [phone]   For scheduling needs, including cancellations and rescheduling, please call Brittany, [phone].     Echo will be scheduled at [address].  Your physician has requested that you have an echocardiogram. Echocardiography is a painless test that uses sound waves to create images of your heart. It provides your doctor with information about the size and shape of your heart and how well  your heart's chambers and valves are working. This procedure takes approximately one hour. There are no restrictions for this procedure. Please do NOT wear cologne, perfume, aftershave, or lotions (deodorant is allowed). Please arrive 15 minutes prior to your appointment time.    Follow-Up: At [facility], you and  your health needs are our priority.  As part of our continuing mission to provide you with exceptional heart care, we have created designated Provider Care Teams.  These Care Teams include your primary Cardiologist (physician) and Advanced Practice Providers (APPs -  Physician Assistants and Nurse Practitioners) who all work together to provide you with the care you need, when you need it.  We recommend signing up for the patient portal called MyChart.  Sign up information is provided on this After Visit Summary.  MyChart is used to connect with patients for Virtual Visits (Telemedicine).  Patients are able to view lab/test results, encounter notes, upcoming appointments, etc.  Non-urgent messages can be sent to your provider as well.   To learn more about what you can do with MyChart, go to [email or web address].    Your next appointment:   1 year(s)  The format for your next appointment:   In Person  Provider:   Darryle Decent, MD   Other Instructions Dr. Decent has referred you to PharmD for lipid management

## 2023-02-24 LAB — BASIC METABOLIC PANEL
BUN/Creatinine Ratio: 11 — ABNORMAL LOW (ref 12–28)
BUN: 10 mg/dL (ref 8–27)
CO2: 23 mmol/L (ref 20–29)
Calcium: 9.5 mg/dL (ref 8.7–10.3)
Chloride: 106 mmol/L (ref 96–106)
Creatinine, Ser: 0.9 mg/dL (ref 0.57–1.00)
Glucose: 108 mg/dL — ABNORMAL HIGH (ref 70–99)
Potassium: 4.6 mmol/L (ref 3.5–5.2)
Sodium: 145 mmol/L — ABNORMAL HIGH (ref 134–144)
eGFR: 67 mL/min/{1.73_m2} (ref >59)

## 2023-02-24 LAB — LIPOPROTEIN A (LPA): Lipoprotein (a): 20.3 nmol/L (ref <75.0)

## 2023-03-10 ENCOUNTER — Telehealth (HOSPITAL_COMMUNITY): Payer: Self-pay | Admitting: *Deleted

## 2023-03-10 NOTE — Telephone Encounter (Signed)
Attempted to call patient regarding upcoming cardiac CT appointment. Left message on voicemail with name and callback number Johney Frame RN Navigator Cardiac Imaging [facility] [phone] Office

## 2023-03-11 ENCOUNTER — Encounter: Payer: Self-pay | Admitting: Cardiovascular Disease

## 2023-03-11 ENCOUNTER — Other Ambulatory Visit: Payer: Self-pay | Admitting: Cardiovascular Disease

## 2023-03-11 ENCOUNTER — Ambulatory Visit (HOSPITAL_BASED_OUTPATIENT_CLINIC_OR_DEPARTMENT_OTHER)
Admission: RE | Admit: 2023-03-11 | Discharge: 2023-03-11 | Disposition: A | Discharge status: Home / Self Care | Payer: Medicare Other | Source: Ambulatory Visit | Attending: Cardiovascular Disease | Admitting: Cardiovascular Disease

## 2023-03-11 ENCOUNTER — Ambulatory Visit (HOSPITAL_COMMUNITY)
Admission: RE | Admit: 2023-03-11 | Discharge: 2023-03-11 | Disposition: A | Discharge status: Home / Self Care | Payer: Medicare Other | Source: Ambulatory Visit | Attending: Cardiovascular Disease | Admitting: Cardiovascular Disease

## 2023-03-11 DIAGNOSIS — Z01812 Encounter for preprocedural laboratory examination: Secondary | ICD-10-CM | POA: Diagnosis present

## 2023-03-11 DIAGNOSIS — R931 Abnormal findings on diagnostic imaging of heart and coronary circulation: Secondary | ICD-10-CM | POA: Diagnosis present

## 2023-03-11 DIAGNOSIS — R918 Other nonspecific abnormal finding of lung field: Secondary | ICD-10-CM | POA: Insufficient documentation

## 2023-03-11 DIAGNOSIS — K760 Fatty (change of) liver, not elsewhere classified: Secondary | ICD-10-CM | POA: Insufficient documentation

## 2023-03-11 DIAGNOSIS — I700 Atherosclerosis of aorta: Secondary | ICD-10-CM | POA: Insufficient documentation

## 2023-03-11 DIAGNOSIS — Z01810 Encounter for preprocedural cardiovascular examination: Secondary | ICD-10-CM | POA: Insufficient documentation

## 2023-03-11 DIAGNOSIS — I2510 Atherosclerotic heart disease of native coronary artery without angina pectoris: Secondary | ICD-10-CM

## 2023-03-11 MED ORDER — NITROGLYCERIN 0.4 MG SL SUBL
SUBLINGUAL_TABLET | SUBLINGUAL | Status: AC
  Filled 2023-03-11: qty 2

## 2023-03-11 MED ORDER — NITROGLYCERIN 0.4 MG SL SUBL
0.8000 mg | SUBLINGUAL_TABLET | Freq: Once | SUBLINGUAL | Status: AC
  Administered 2023-03-11: 0.8 mg via SUBLINGUAL

## 2023-03-11 MED ORDER — IOHEXOL 350 MG/ML SOLN
100.0000 mL | Freq: Once | INTRAVENOUS | Status: AC | PRN
  Administered 2023-03-11: 100 mL via INTRAVENOUS

## 2023-03-16 ENCOUNTER — Encounter (HOSPITAL_COMMUNITY): Payer: Self-pay | Admitting: Gastroenterology

## 2023-03-22 ENCOUNTER — Ambulatory Visit (HOSPITAL_COMMUNITY)
Admission: RE | Admit: 2023-03-22 | Discharge: 2023-03-22 | Disposition: A | Discharge status: Home / Self Care | Payer: Medicare Other | Source: Ambulatory Visit | Attending: Cardiology | Admitting: Cardiology

## 2023-03-22 DIAGNOSIS — I2510 Atherosclerotic heart disease of native coronary artery without angina pectoris: Secondary | ICD-10-CM | POA: Insufficient documentation

## 2023-03-22 LAB — ECHOCARDIOGRAM COMPLETE
AR max vel: 2.83 cm2
AV Area VTI: 2.59 cm2
AV Area mean vel: 2.55 cm2
AV Mean grad: 3 mm[Hg]
AV Peak grad: 5.3 mm[Hg]
Ao pk vel: 1.15 m/s
Area-P 1/2: 2.66 cm2
MV M vel: 0.43 m/s
MV Peak grad: 0.7 mm[Hg]
S' Lateral: 2.73 cm

## 2023-03-23 ENCOUNTER — Ambulatory Visit (HOSPITAL_COMMUNITY): Payer: Medicare Other | Admitting: Registered Nurse

## 2023-03-23 ENCOUNTER — Ambulatory Visit (HOSPITAL_BASED_OUTPATIENT_CLINIC_OR_DEPARTMENT_OTHER): Payer: Medicare Other | Admitting: Registered Nurse

## 2023-03-23 ENCOUNTER — Encounter (HOSPITAL_COMMUNITY)
Admission: RE | Disposition: A | Discharge status: Home / Self Care | Payer: Self-pay | Source: Home / Self Care | Attending: Gastroenterology

## 2023-03-23 ENCOUNTER — Encounter: Payer: Self-pay | Admitting: Cardiovascular Disease

## 2023-03-23 ENCOUNTER — Encounter (HOSPITAL_COMMUNITY): Payer: Self-pay | Admitting: Gastroenterology

## 2023-03-23 ENCOUNTER — Other Ambulatory Visit: Payer: Self-pay

## 2023-03-23 ENCOUNTER — Ambulatory Visit (HOSPITAL_COMMUNITY)
Admission: RE | Admit: 2023-03-23 | Discharge: 2023-03-23 | Disposition: A | Discharge status: Home / Self Care | Payer: Medicare Other | Attending: Gastroenterology | Admitting: Gastroenterology

## 2023-03-23 DIAGNOSIS — D120 Benign neoplasm of cecum: Secondary | ICD-10-CM | POA: Insufficient documentation

## 2023-03-23 DIAGNOSIS — Z79899 Other long term (current) drug therapy: Secondary | ICD-10-CM | POA: Diagnosis not present

## 2023-03-23 DIAGNOSIS — Z1211 Encounter for screening for malignant neoplasm of colon: Secondary | ICD-10-CM

## 2023-03-23 DIAGNOSIS — K644 Residual hemorrhoidal skin tags: Secondary | ICD-10-CM | POA: Diagnosis not present

## 2023-03-23 DIAGNOSIS — I10 Essential (primary) hypertension: Secondary | ICD-10-CM | POA: Diagnosis not present

## 2023-03-23 DIAGNOSIS — Z87891 Personal history of nicotine dependence: Secondary | ICD-10-CM | POA: Insufficient documentation

## 2023-03-23 DIAGNOSIS — K5730 Diverticulosis of large intestine without perforation or abscess without bleeding: Secondary | ICD-10-CM | POA: Insufficient documentation

## 2023-03-23 DIAGNOSIS — K648 Other hemorrhoids: Secondary | ICD-10-CM | POA: Insufficient documentation

## 2023-03-23 HISTORY — PX: HEMOSTASIS CLIP PLACEMENT: SHX6857

## 2023-03-23 HISTORY — PX: COLONOSCOPY WITH PROPOFOL: SHX5780

## 2023-03-23 HISTORY — PX: SUBMUCOSAL INJECTION: SHX5543

## 2023-03-23 HISTORY — PX: POLYPECTOMY: SHX5525

## 2023-03-23 SURGERY — COLONOSCOPY WITH PROPOFOL
Anesthesia: Monitor Anesthesia Care

## 2023-03-23 MED ORDER — SODIUM CHLORIDE 0.9 % IV SOLN: INTRAVENOUS | Status: DC | PRN

## 2023-03-23 MED ORDER — SODIUM CHLORIDE (PF) 0.9 % IJ SOLN
PREFILLED_SYRINGE | INTRAMUSCULAR | Status: DC | PRN
  Administered 2023-03-23: 3 mL

## 2023-03-23 MED ORDER — EPINEPHRINE 1 MG/10ML IJ SOSY
PREFILLED_SYRINGE | INTRAMUSCULAR | Status: AC
  Filled 2023-03-23: qty 10

## 2023-03-23 MED ORDER — ATROPINE SULFATE 0.4 MG/ML IV SOLN
INTRAVENOUS | Status: DC | PRN
  Administered 2023-03-23: .5 mg via INTRAVENOUS

## 2023-03-23 MED ORDER — PROPOFOL 1000 MG/100ML IV EMUL
INTRAVENOUS | Status: AC
  Filled 2023-03-23: qty 100

## 2023-03-23 MED ORDER — PROPOFOL 10 MG/ML IV BOLUS
INTRAVENOUS | Status: DC | PRN
  Administered 2023-03-23: 30 mg via INTRAVENOUS
  Administered 2023-03-23: 50 ug/kg/min via INTRAVENOUS

## 2023-03-23 SURGICAL SUPPLY — 20 items
ELECT REM PT RETURN 9FT ADLT (ELECTROSURGICAL)
ELECTRODE REM PT RTRN 9FT ADLT (ELECTROSURGICAL) IMPLANT
FLOOR PAD 36X40 (MISCELLANEOUS) ×2
FORCEPS BIOP RAD 4 LRG CAP 4 (CUTTING FORCEPS) IMPLANT
FORCEPS BIOP RJ4 240 W/NDL (CUTTING FORCEPS)
FORCEPS BXJMBJMB 240X2.8X (CUTTING FORCEPS) IMPLANT
INJECTOR/SNARE I SNARE (MISCELLANEOUS) IMPLANT
LUBRICANT JELLY 4.5OZ STERILE (MISCELLANEOUS) IMPLANT
MANIFOLD NEPTUNE II (INSTRUMENTS) IMPLANT
NDL SCLEROTHERAPY 25GX240 (NEEDLE) IMPLANT
NEEDLE SCLEROTHERAPY 25GX240 (NEEDLE)
PAD FLOOR 36X40 (MISCELLANEOUS) ×2 IMPLANT
PROBE APC STR FIRE (PROBE) IMPLANT
PROBE INJECTION GOLD 7FR (MISCELLANEOUS) IMPLANT
SNARE ROTATE MED OVAL 20MM (MISCELLANEOUS) IMPLANT
SYR 50ML LL SCALE MARK (SYRINGE) IMPLANT
TRAP SPECIMEN MUCOUS 40CC (MISCELLANEOUS) IMPLANT
TUBING ENDO SMARTCAP PENTAX (MISCELLANEOUS) IMPLANT
TUBING IRRIGATION ENDOGATOR (MISCELLANEOUS) ×2 IMPLANT
WATER STERILE IRR 1000ML POUR (IV SOLUTION) IMPLANT

## 2023-03-23 NOTE — Discharge Instructions (Signed)
YOU HAD AN ENDOSCOPIC PROCEDURE TODAY: Refer to the procedure report and other information in the discharge instructions given to you for any specific questions about what was found during the examination. If this information does not answer your questions, please call [facility] office at [phone] to clarify.   YOU SHOULD EXPECT: Some feelings of bloating in the abdomen. Passage of more gas than usual. Walking can help get rid of the air that was put into your GI tract during the procedure and reduce the bloating. If you had a lower endoscopy (such as a colonoscopy or flexible sigmoidoscopy) you may notice spotting of blood in your stool or on the toilet paper. Some abdominal soreness may be present for a day or two, also.  DIET: Your first meal following the procedure should be a light meal and then it is ok to progress to your normal diet. A half-sandwich or bowl of soup is an example of a good first meal. Heavy or fried foods are harder to digest and may make you feel nauseous or bloated. Drink plenty of fluids but you should avoid alcoholic beverages for 24 hours. If you had a esophageal dilation, please see attached instructions for diet.   ACTIVITY: Your care partner should take you home directly after the procedure. You should plan to take it easy, moving slowly for the rest of the day. You can resume normal activity the day after the procedure however YOU SHOULD NOT DRIVE, use power tools, machinery or perform tasks that involve climbing or major physical exertion for 24 hours (because of the sedation medicines used during the test).   SYMPTOMS TO REPORT IMMEDIATELY: A gastroenterologist can be reached at any hour. Please call [phone]  for any of the following symptoms:  . Following lower endoscopy (colonoscopy, flexible sigmoidoscopy) Excessive amounts of blood in the stool  Significant tenderness, worsening of abdominal pains  Swelling of the abdomen that is new, acute  Fever of 100  or higher   FOLLOW UP:  If any biopsies were taken you will be contacted by phone or by letter within the next 1-3 weeks. Call [phone]  if you have not heard about the biopsies in 3 weeks.  Please also call with any specific questions about appointments or follow up tests.

## 2023-03-23 NOTE — Anesthesia Postprocedure Evaluation (Signed)
Anesthesia Post Note  Patient: [name] Riley  Procedure(s) Performed: COLONOSCOPY WITH PROPOFOL POLYPECTOMY HEMOSTASIS CLIP PLACEMENT SUBMUCOSAL INJECTION     Patient location during evaluation: PACU Anesthesia Type: MAC Level of consciousness: awake and alert Pain management: pain level controlled Vital Signs Assessment: post-procedure vital signs reviewed and stable Respiratory status: spontaneous breathing, nonlabored ventilation, respiratory function stable and patient connected to nasal cannula oxygen Cardiovascular status: stable and blood pressure returned to baseline Postop Assessment: no apparent nausea or vomiting Anesthetic complications: no  No notable events documented.  Last Vitals:  Vitals:   03/23/23 1050 03/23/23 1100  BP: (!) 156/64 (!) 152/66  Pulse: 62 (!) 58  Resp: 19 19  Temp:    SpO2: 97% 100%    Last Pain:  Vitals:   03/23/23 1100  TempSrc:   PainSc: 0-No pain                 Kennieth Rad

## 2023-03-23 NOTE — Transfer of Care (Signed)
Immediate Anesthesia Transfer of Care Note  Patient: [name] Riley  Procedure(s) Performed: COLONOSCOPY WITH PROPOFOL POLYPECTOMY HEMOSTASIS CLIP PLACEMENT SUBMUCOSAL INJECTION  Patient Location: PACU and Endoscopy Unit  Anesthesia Type:MAC  Level of Consciousness: awake, alert , and oriented  Airway & Oxygen Therapy: Patient Spontanous Breathing  Post-op Assessment: Report given to RN  Post vital signs: stable  Last Vitals:  Vitals Value Taken Time  BP 170/73 03/23/23 1040  Temp    Pulse 66 03/23/23 1041  Resp 18 03/23/23 1041  SpO2 100 % 03/23/23 1041  Vitals shown include unfiled device data.  Last Pain:  Vitals:   03/23/23 0927  TempSrc: Temporal  PainSc: 0-No pain         Complications: No notable events documented.

## 2023-03-23 NOTE — Anesthesia Preprocedure Evaluation (Signed)
Anesthesia Evaluation  Patient identified by MRN, date of birth, ID band Patient awake    Reviewed: Allergy & Precautions, NPO status , Patient's Chart, lab work & pertinent test results  History of Anesthesia Complications (+) PONV and history of anesthetic complications  Airway Mallampati: II  TM Distance: >3 FB Neck ROM: Full    Dental no notable dental hx.    Pulmonary neg pulmonary ROS, former smoker   Pulmonary exam normal breath sounds clear to auscultation       Cardiovascular hypertension, Pt. on medications and Pt. on home beta blockers Normal cardiovascular exam Rhythm:Regular Rate:Normal     Neuro/Psych   Anxiety     negative neurological ROS     GI/Hepatic negative GI ROS, Neg liver ROS,,,  Endo/Other  negative endocrine ROS    Renal/GU negative Renal ROS  negative genitourinary   Musculoskeletal negative musculoskeletal ROS (+)    Abdominal   Peds negative pediatric ROS (+)  Hematology negative hematology ROS (+)   Anesthesia Other Findings   Reproductive/Obstetrics negative OB ROS                             Anesthesia Physical Anesthesia Plan  ASA: 2  Anesthesia Plan: MAC   Post-op Pain Management:    Induction: Intravenous  PONV Risk Score and Plan: 3 and Propofol infusion and Treatment may vary due to age or medical condition  Airway Management Planned: Simple Face Mask and Natural Airway  Additional Equipment:   Intra-op Plan:   Post-operative Plan:   Informed Consent: I have reviewed the patients History and Physical, chart, labs and discussed the procedure including the risks, benefits and alternatives for the proposed anesthesia with the patient or authorized representative who has indicated his/her understanding and acceptance.     Dental advisory given  Plan Discussed with: CRNA and Surgeon  Anesthesia Plan Comments:         Anesthesia  Quick Evaluation

## 2023-03-23 NOTE — Anesthesia Procedure Notes (Signed)
Procedure Name: MAC Date/Time: 03/23/2023 10:00 AM  Performed by: Micki Riley, CRNAPre-anesthesia Checklist: Patient identified, Emergency Drugs available, Suction available, Patient being monitored and Timeout performed Patient Re-evaluated:Patient Re-evaluated prior to induction Oxygen Delivery Method: Simple face mask Preoxygenation: Pre-oxygenation with 100% oxygen Induction Type: IV induction

## 2023-03-23 NOTE — Op Note (Signed)
[facility] Patient Name: [name] Riley Procedure Date: 03/23/2023 MRN: [id number] Attending MD: Kathi Der , MD, [id number] Date of Birth: 05-Apr-1950 CSN: [id number] Age: 73 Admit Type: Outpatient Procedure:                Colonoscopy Indications:              High risk colon cancer surveillance: Personal                            history of sessile serrated colon polyp (10 mm or                            greater in size), Last colonoscopy: 2022 Providers:                Kathi Der, MD, Suzy Bouchard, RN, Geoffery Lyons, Technician Referring MD:              Medicines:                Sedation Administered by an Anesthesia Professional Complications:            No immediate complications. Estimated Blood Loss:     Estimated blood loss was minimal. Procedure:                Pre-Anesthesia Assessment:                           - Prior to the procedure, a History and Physical                            was performed, and patient medications and                            allergies were reviewed. The patient's tolerance of                            previous anesthesia was also reviewed. The risks                            and benefits of the procedure and the sedation                            options and risks were discussed with the patient.                            All questions were answered, and informed consent                            was obtained. Prior Anticoagulants: The patient has                            taken no anticoagulant or antiplatelet agents  except for aspirin. ASA Grade Assessment: II - A                            patient with mild systemic disease. After reviewing                            the risks and benefits, the patient was deemed in                            satisfactory condition to undergo the procedure.                           After obtaining informed consent, the  colonoscope                            was passed under direct vision. Throughout the                            procedure, the patient's blood pressure, pulse, and                            oxygen saturations were monitored continuously. The                            [id number]-HQ190L ([id number]) Olympus colonoscope was                            introduced through the anus and advanced to the the                            cecum, identified by appendiceal orifice and                            ileocecal valve. The colonoscopy was unusually                            difficult due to a tortuous colon. Successful                            completion of the procedure was aided by                            straightening and shortening the scope to obtain                            bowel loop reduction. The patient tolerated the                            procedure well. The quality of the bowel                            preparation was adequate to identify polyps greater  than 5 mm in size. The ileocecal valve, appendiceal                            orifice, and rectum were photographed. Scope In: 9:56:01 AM Scope Out: 10:30:36 AM Scope Withdrawal Time: 0 hours 25 minutes 25 seconds  Total Procedure Duration: 0 hours 34 minutes 35 seconds  Findings:      Skin tags were found on perianal exam.      The colon (entire examined portion) was significantly tortuous.      A 20 mm polyp was found in the cecum. The polyp was multi-lobulated and       sessile. The polyp was removed with a piecemeal technique using a hot       snare. Polyp resection was incomplete. The resected tissue was       retrieved. Polypectomy was difficult as I was not able to achieve       straight short position. Incomplete resection. Resected tissue       retrieved. There was oozing of blood from post polypectomy site. To       prevent bleeding after the polypectomy, three hemostatic clips were        successfully placed. Area was successfully injected with 3 mL of a 0.1       mg/mL solution of epinephrine for hemostasis.      Multiple diverticula were found in the sigmoid colon and descending       colon.      Internal hemorrhoids were found during retroflexion. The hemorrhoids       were small. Impression:               - Perianal skin tags found on perianal exam.                           - Tortuous colon.                           - One 20 mm polyp in the cecum, removed piecemeal                            using a hot snare. Polypectomy was difficult as I                            was not able to achieve straight short position.                           Incomplete resection. Resected tissue retrieved.                            There was oozing of blood from post polypectomy                            site. Clips were placed. Injected.                           - Diverticulosis in the sigmoid colon and in the  descending colon.                           - Internal hemorrhoids. Moderate Sedation:      Moderate (conscious) sedation was personally administered by an       anesthesia professional. The following parameters were monitored: oxygen       saturation, heart rate, blood pressure, and response to care. Recommendation:           - Patient has a contact number available for                            emergencies. The signs and symptoms of potential                            delayed complications were discussed with the                            patient. Return to normal activities tomorrow.                            Written discharge instructions were provided to the                            patient.                           - Resume previous diet.                           - Continue present medications.                           - Await pathology results.                           - Repeat colonoscopy date to be determined after                             pending pathology results are reviewed for                            surveillance after piecemeal polypectomy. She may                            need a referral for EMR to achieve complete                            polypectomy.                           - No aspirin, ibuprofen, naproxen, or other                            non-steroidal anti-inflammatory drugs for 7 days  after polyp removal. Procedure Code(s):        --- Professional ---                           [phone], Colonoscopy, flexible; with removal of                            tumor(s), polyp(s), or other lesion(s) by snare                            technique Diagnosis Code(s):        --- Professional ---                           Z86.010, Personal history of colonic polyps                           D12.0, Benign neoplasm of cecum                           K64.8, Other hemorrhoids                           K64.4, Residual hemorrhoidal skin tags                           K57.30, Diverticulosis of large intestine without                            perforation or abscess without bleeding                           Q43.8, Other specified congenital malformations of                            intestine CPT copyright 2022 [employer]. All rights reserved. The codes documented in this report are preliminary and upon coder review may  be revised to meet current compliance requirements. Kathi Der, MD Kathi Der, MD 03/23/2023 10:46:32 AM Number of Addenda: 0

## 2023-03-23 NOTE — H&P (Signed)
Primary Care Physician:  Merri Brunette, MD Primary Gastroenterologist:  Dr. Levora Angel  Reason for Visit -surveillance colonoscopy  HPI: [name] Riley is a 73 y.o. female with past medical history mentioned below including history of bradycardia here for outpatient surveillance colonoscopy.  Last colonoscopy in October 2022 showed fair prep, 10 mm sessile serrated adenoma in the cecum, hyperplastic polyp in the sigmoid colon.  Random biopsies were negative for microscopic colitis.  Patient denies any GI symptoms today.  Past Medical History:  Diagnosis Date   Allergy    Anemia    hx of   Anxiety    Bradycardia    Bronchitis    hx of   Chronic back pain    Complication of anesthesia    "hard to wake up" and has bradycardia   Depression    Diverticulosis    DJD (degenerative joint disease)    Hx of colonoscopy 12/2003   Hyperlipidemia    Hypertension    Insomnia    PONV (postoperative nausea and vomiting)    Symptomatic cholelithiasis     Past Surgical History:  Procedure Laterality Date   APPENDECTOMY     BIOPSY  11/22/2020   Procedure: BIOPSY;  Surgeon: Kathi Der, MD;  Location: [facility];  Service: Gastroenterology;;   btl     CHOLECYSTECTOMY N/A 11/07/2015   Procedure: LAPAROSCOPIC CHOLECYSTECTOMY WITH INTRAOPERATIVE CHOLANGIOGRAM AND X-RAY BILE DUCT;  Surgeon: Avel Peace, MD;  Location: [facility];  Service: General;  Laterality: N/A;   COLONOSCOPY     COLONOSCOPY WITH PROPOFOL N/A 11/22/2020   Procedure: COLONOSCOPY WITH PROPOFOL;  Surgeon: Kathi Der, MD;  Location: [facility];  Service: Gastroenterology;  Laterality: N/A;   HERNIA REPAIR     INSERTION OF MESH  12/17/2011   Procedure: INSERTION OF MESH;  Surgeon: Wilmon Arms. Corliss Skains, MD;  Location: [facility] OR;  Service: General;  Laterality: N/A;   KNEE SURGERY     right knee   neck discectomy     POLYPECTOMY  11/22/2020   Procedure: POLYPECTOMY;  Surgeon: Kathi Der, MD;  Location: [facility];  Service: Gastroenterology;;   TUBAL LIGATION     VENTRAL HERNIA REPAIR  12/17/2011   Procedure: HERNIA REPAIR VENTRAL ADULT;  Surgeon: Wilmon Arms. Corliss Skains, MD;  Location: [facility] OR;  Service: General;  Laterality: N/A;    Prior to Admission medications   Medication Sig Start Date End Date Taking? Authorizing Provider  ALPRAZolam (XANAX) 0.25 MG tablet Take 0.25 mg by mouth at bedtime. For sleep   Yes [provider]  aspirin EC 81 MG tablet Take 1 tablet (81 mg total) by mouth daily. Swallow whole. 02/23/23  Yes O'Neal, Ronnald Ramp, MD  Calcium Carbonate-Vitamin D (CALTRATE 600+D PO) Take 1 tablet by mouth daily.    Yes [provider]  escitalopram (LEXAPRO) 20 MG tablet Take 20 mg by mouth daily.   Yes [provider]  fish oil-omega-3 fatty acids 1000 MG capsule Take 1 g by mouth 2 (two) times daily.    Yes [provider]  lisinopril (PRINIVIL,ZESTRIL) 20 MG tablet Take 1 tablet (20 mg total) by mouth daily. 06/05/14  Yes Lyn Records, MD  Multiple Vitamin (MULTIVITAMIN WITH MINERALS) TABS Take 1 tablet by mouth daily.   Yes [provider]  traZODone (DESYREL) 50 MG tablet Take 50 mg by mouth at bedtime.   Yes [provider]  Turmeric 500 MG CAPS Take 500 mg by mouth daily.   Yes [provider]  cetirizine (ZYRTEC) 10 MG tablet Take 10 mg by mouth daily as needed for allergies.  Patient not taking: Reported on 03/23/2023    [provider]  diclofenac (VOLTAREN) 75 MG EC tablet Take 1 tablet (75 mg total) by mouth 2 (two) times daily. Patient not taking: Reported on 03/16/2023 07/22/22   Lenn Sink, DPM  metoprolol tartrate (LOPRESSOR) 25 MG tablet Take 1 tablet (25mg ) 2 hours before cardiac procedure Patient not taking: Reported on 03/16/2023 02/23/23   Sande Rives, MD    Scheduled Meds: Continuous Infusions: PRN Meds:.  Allergies as of 01/28/2023 - Review Complete 08/21/2022  Allergen Reaction  Noted   Codeine Nausea Only 10/22/2011    Family History  Problem Relation Age of Onset   Heart disease Mother    Hypertension Mother    Heart attack Mother        x2   Stroke Mother    Heart disease Maternal Aunt     Social History   Socioeconomic History   Marital status: Divorced    Spouse name: Not on file   Number of children: 2   Years of education: Not on file   Highest education level: Not on file  Occupational History   Occupation: Retired [employer]  Tobacco Use   Smoking status: Former    Current packs/day: 0.00    Types: Cigarettes    Quit date: 05/09/1997    Years since quitting: 25.8   Smokeless tobacco: Never  Substance and Sexual Activity   Alcohol use: No   Drug use: No   Sexual activity: Never  Other Topics Concern   Not on file  Social History Narrative   Not on file   Social Drivers of Health   Financial Resource Strain: Not on file  Food Insecurity: Not on file  Transportation Needs: Not on file  Physical Activity: Not on file  Stress: Not on file  Social Connections: Unknown (11/24/2022)   [facility]   Social Network    Social Network: Not on file  Intimate Partner Violence: Unknown (11/24/2022)   Received from [facility]   HITS    Physically Hurt: Not on file    Insult or Talk Down To: Not on file    Threaten Physical Harm: Not on file    Scream or Curse: Not on file      Physical Exam: Vital signs: Vitals:   03/23/23 0927  BP: (!) 146/56  Pulse: (!) 54  Resp: 17  Temp: 97.9 F (36.6 C)  SpO2: 97%     General:   Alert,  Well-developed, well-nourished, pleasant and cooperative in NAD Lungs:  Clear throughout to auscultation.   No wheezes, crackles, or rhonchi. No acute distress. Heart:  Regular rate and rhythm; no murmurs, clicks, rubs,  or gallops. Abdomen: Soft, nontender, nondistended, bowel sound present, no peritoneal signs Rectal:  Deferred  GI:  Lab Results: No results for  input(s): "WBC", "HGB", "HCT", "PLT" in the last 72 hours. BMET No results for input(s): "NA", "K", "CL", "CO2", "GLUCOSE", "BUN", "CREATININE", "CALCIUM" in the last 72 hours. LFT No results for input(s): "PROT", "ALBUMIN", "AST", "ALT", "ALKPHOS", "BILITOT", "BILIDIR", "IBILI" in the last 72 hours. PT/INR No results for input(s): "LABPROT", "INR" in the last 72 hours.   Studies/Results: ECHOCARDIOGRAM COMPLETE Result Date: 03/22/2023    ECHOCARDIOGRAM REPORT   Patient Name:   [name] Riley Date of Exam: 03/22/2023 Medical Rec #:  [id number]  Height:       62.0 in Accession #:    [id number]     Weight:       154.0 lb Date of Birth:  June 06, 1950       BSA:          1.711 m Patient Age:    72 years       BP:           127/65 mmHg Patient Gender: F              HR:           48 bpm. Exam Location:  Outpatient Procedure: 2D Echo, 3D Echo, Color Doppler, Cardiac Doppler and Strain Analysis Indications:    Coronary artery disease involving native coronary artery of                 native heart without angina pectoris [I25.10 (ICD-10-CM)]  History:        Patient has no prior history of Echocardiogram examinations.                 Risk Factors:Hypertension and Dyslipidemia.  Sonographer:    Gertie Fey MHA, RDMS, RVT, RDCS Referring Phys: [id number] [facility] O'NEAL  Sonographer Comments: Global longitudinal strain was attempted. IMPRESSIONS  1. Left ventricular ejection fraction, by estimation, is 60 to 65%. The left ventricle has normal function. The left ventricle has no regional wall motion abnormalities. There is mild left ventricular hypertrophy. Left ventricular diastolic parameters are consistent with Grade I diastolic dysfunction (impaired relaxation).  2. Right ventricular systolic function is normal. The right ventricular size is normal. Tricuspid regurgitation signal is inadequate for assessing PA pressure.  3. The mitral valve is grossly normal. Trivial mitral valve regurgitation.  4. The  aortic valve is tricuspid. Aortic valve regurgitation is trivial. Aortic valve sclerosis is present, with no evidence of aortic valve stenosis.  5. The inferior vena cava is normal in size with greater than 50% respiratory variability, suggesting right atrial pressure of 3 mmHg. FINDINGS  Left Ventricle: Left ventricular ejection fraction, by estimation, is 60 to 65%. The left ventricle has normal function. The left ventricle has no regional wall motion abnormalities. The left ventricular internal cavity size was normal in size. There is  mild left ventricular hypertrophy. Left ventricular diastolic parameters are consistent with Grade I diastolic dysfunction (impaired relaxation). Right Ventricle: The right ventricular size is normal. No increase in right ventricular wall thickness. Right ventricular systolic function is normal. Tricuspid regurgitation signal is inadequate for assessing PA pressure. Left Atrium: Left atrial size was normal in size. Right Atrium: Right atrial size was normal in size. Pericardium: There is no evidence of pericardial effusion. Mitral Valve: The mitral valve is grossly normal. Trivial mitral valve regurgitation. Tricuspid Valve: The tricuspid valve is normal in structure. Tricuspid valve regurgitation is trivial. Aortic Valve: The aortic valve is tricuspid. Aortic valve regurgitation is trivial. Aortic valve sclerosis is present, with no evidence of aortic valve stenosis. Aortic valve mean gradient measures 3.0 mmHg. Aortic valve peak gradient measures 5.3 mmHg. Aortic valve area, by VTI measures 2.59 cm. Pulmonic Valve: The pulmonic valve was not well visualized. Pulmonic valve regurgitation is trivial. Aorta: The aortic root and ascending aorta are structurally normal, with no evidence of dilitation. Venous: The inferior vena cava is normal in size with greater than 50% respiratory variability, suggesting right atrial pressure of 3 mmHg. IAS/Shunts: The interatrial septum was not  well visualized.  LEFT VENTRICLE PLAX  2D LVIDd:         4.26 cm   Diastology LVIDs:         2.73 cm   LV e' medial:    4.79 cm/s LV PW:         1.08 cm   LV E/e' medial:  12.3 LV IVS:        0.84 cm   LV e' lateral:   4.46 cm/s LVOT diam:     2.00 cm   LV E/e' lateral: 13.2 LV SV:         75 LV SV Index:   44 LVOT Area:     3.16 cm                           3D Volume EF:                          3D EF:        51 %                          LV EDV:       98 ml                          LV ESV:       48 ml                          LV SV:        50 ml RIGHT VENTRICLE RV Basal diam:  2.79 cm RV Mid diam:    2.41 cm RV S prime:     16.10 cm/s TAPSE (M-mode): 2.2 cm LEFT ATRIUM             Index        RIGHT ATRIUM           Index LA diam:        3.13 cm 1.83 cm/m   RA Area:     11.00 cm LA Vol (A2C):   39.8 ml 23.27 ml/m  RA Volume:   25.00 ml  14.61 ml/m LA Vol (A4C):   28.4 ml 16.60 ml/m LA Biplane Vol: 34.7 ml 20.28 ml/m  AORTIC VALVE AV Area (Vmax):    2.83 cm AV Area (Vmean):   2.55 cm AV Area (VTI):     2.59 cm AV Vmax:           115.00 cm/s AV Vmean:          77.800 cm/s AV VTI:            0.289 m AV Peak Grad:      5.3 mmHg AV Mean Grad:      3.0 mmHg LVOT Vmax:         103.00 cm/s LVOT Vmean:        62.900 cm/s LVOT VTI:          0.237 m LVOT/AV VTI ratio: 0.82  AORTA Ao Root diam: 3.12 cm Ao Asc diam:  3.16 cm MITRAL VALVE MV Area (PHT): 2.66 cm    SHUNTS MV Decel Time: 285 msec    Systemic VTI:  0.24 m MR Peak grad: 0.7 mmHg     Systemic Diam: 2.00 cm MR Vmax:      43.00 cm/s MV E velocity: 58.70 cm/s  MV A velocity: 82.30 cm/s MV E/A ratio:  0.71 Epifanio Lesches MD Electronically signed by Epifanio Lesches MD Signature Date/Time: 03/22/2023/11:28:32 PM    Final     Impression/Plan: -History of sessile serrated adenoma  Recommendations -------------------------- -Proceed with colonoscopy today.  Risks (bleeding, infection, bowel perforation that could require surgery, sedation-related  changes in cardiopulmonary systems), benefits (identification and possible treatment of source of symptoms, exclusion of certain causes of symptoms), and alternatives (watchful waiting, radiographic imaging studies, empiric medical treatment)  were explained to patient/family in detail and patient wishes to proceed.     LOS: 0 days   Kathi Der  MD, FACP 03/23/2023, 9:37 AM  Contact #  [phone]

## 2023-03-25 LAB — SURGICAL PATHOLOGY

## 2023-03-26 ENCOUNTER — Encounter (HOSPITAL_COMMUNITY): Payer: Self-pay | Admitting: Gastroenterology

## 2023-03-30 ENCOUNTER — Encounter: Payer: Self-pay | Admitting: Pharmacist Clinician (PhC)/ Clinical Pharmacy Specialist

## 2023-03-30 ENCOUNTER — Telehealth: Payer: Self-pay | Admitting: Pharmacist Clinician (PhC)/ Clinical Pharmacy Specialist

## 2023-03-30 ENCOUNTER — Ambulatory Visit
Payer: Medicare Other | Attending: Cardiovascular Disease | Admitting: Pharmacist Clinician (PhC)/ Clinical Pharmacy Specialist

## 2023-03-30 ENCOUNTER — Other Ambulatory Visit (HOSPITAL_COMMUNITY): Payer: Self-pay

## 2023-03-30 ENCOUNTER — Telehealth: Payer: Self-pay | Admitting: Pharmacy Technician

## 2023-03-30 DIAGNOSIS — E7849 Other hyperlipidemia: Secondary | ICD-10-CM

## 2023-03-30 NOTE — Progress Notes (Signed)
Office Visit    Patient Name: [name] Riley Date of Encounter: 03/30/2023  Primary Care Provider:  Merri Brunette, MD Primary Cardiologist:  None  Chief Complaint    Hyperlipidemia - familial   Significant Past Medical History   CAD 1/25 CAC = 679 - 91st percentile(~50% in LAD; 25-49% calcified plaque in LM, LAD, CX)  HTN Controlled on   bradycardia      Allergies  Allergen Reactions   Codeine Nausea Only    History of Present Illness    [name] Riley is a 73 y.o. female patient of Dr Flora Lipps, in the office today to discuss options for cholesterol management.  She was previously on statin therapy, but discontinued because of elevated liver enzymes.  Tried decreasing the dose to three times per week, still had elevated enzymes.  Currently she takes cholestyramine, 1 scoop daily, and she notes that since taking it, her bowel issues have improved greatly.  She asks that we leave her on it, regardless of future medications.  In looking through her records, she did have an LDL cholesterol of 192 in November 2023.  Insurance Carrier:  [employer] - employer retirement plan  LDL Cholesterol goal:  LDL < 70  Current Medications:   cholestyramine 4 gm daily  Previously tried:  atorvastatin, rosuvastatin  Family Hx:   mother died at 67, cardiac disease, did not know father; aunts/uncles all had heart disease; ,daughter/son both with elevated cholesterol, son DM, daughter had gestational DM  Social Hx: Tobacco: quit 04/1997 Alcohol: no  Diet:  mostly home cooked, only occasional eating out,  protein is chicken, beef (no fish) some vegetables, usually canned; snacks on anything - says has cut back on snacking, admits this is her weakness  Exercise: hiking group, every Wednesday through [place] 3-3.5 miles, does her own house hold chores, yard work  Adherence Assessment  Do you ever forget to take your medication? [] Yes [x] No  Do you ever skip doses due to side  effects? [] Yes [x] No  Do you have trouble affording your medicines? [] Yes [x] No  Are you ever unable to pick up your medication due to transportation difficulties? [] Yes [x] No   Adherence strategy: 7 day pill minder  Accessory Clinical Findings   01/26/2023 in [facility]:  TC 234, TG 140, HDL 49, LDL 159  Lab Results  Component Value Date   CHOL 147 06/04/2014   HDL 39.40 06/04/2014   LDLCALC 92 06/04/2014   TRIG 77.0 06/04/2014   CHOLHDL 4 06/04/2014    Lipoprotein (a)  Date/Time Value Ref Range Status  02/23/2023 09:05 AM 20.3 <75.0 nmol/L Final    Comment:    Note:  Values greater than or equal to 75.0 nmol/L may        indicate an independent risk factor for CHD,        but must be evaluated with caution when applied        to non-Caucasian populations due to the        influence of genetic factors on Lp(a) across        ethnicities.     Lab Results  Component Value Date   ALT 41 05/12/2020   AST 28 05/12/2020   ALKPHOS 50 05/12/2020   BILITOT 1.7 (H) 05/12/2020   Lab Results  Component Value Date   CREATININE 0.90 02/23/2023   BUN 10 02/23/2023   NA 145 (H) 02/23/2023   K 4.6 02/23/2023   CL 106 02/23/2023  CO2 23 02/23/2023   No results found for: "HGBA1C"  Home Medications    Current Outpatient Medications  Medication Sig Dispense Refill   Cholecalciferol (VITAMIN D3) 125 MCG (5000 UT) CAPS Take 1 capsule by mouth daily.     cholestyramine (QUESTRAN) 4 GM/DOSE powder Take 4 g by mouth daily.     ALPRAZolam (XANAX) 0.25 MG tablet Take 0.25 mg by mouth at bedtime. For sleep     aspirin EC 81 MG tablet Take 1 tablet (81 mg total) by mouth daily. Swallow whole.     Calcium Carbonate-Vitamin D (CALTRATE 600+D PO) Take 1 tablet by mouth daily.      escitalopram (LEXAPRO) 20 MG tablet Take 20 mg by mouth daily.     fish oil-omega-3 fatty acids 1000 MG capsule Take 1 g by mouth 2 (two) times daily.      lisinopril (PRINIVIL,ZESTRIL) 20 MG tablet Take 1 tablet  (20 mg total) by mouth daily. 90 tablet 3   Multiple Vitamin (MULTIVITAMIN WITH MINERALS) TABS Take 1 tablet by mouth daily.     traZODone (DESYREL) 50 MG tablet Take 50 mg by mouth at bedtime.     Turmeric 500 MG CAPS Take 500 mg by mouth daily.     No current facility-administered medications for this visit.     Assessment & Plan    Hyperlipidemia Assessment: Patient with ASCVD/familial hyperlipidemia not at LDL goal of < 70 Most recent LDL 159 on 01/28/2023; was 192 (12/2021) Has been compliant with cholestyramine, knows this won't achieve goal, but wants to continue for GI benefits Not able to tolerate statins secondary to myalgias Reviewed options for lowering LDL cholesterol, including ezetimibe, PCSK-9 inhibitors, bempedoic acid and inclisiran.  Discussed mechanisms of action, dosing, side effects, potential decreases in LDL cholesterol and costs.  Also reviewed potential options for patient assistance.  Plan: Patient agreeable to starting Repatha  140 mg q14d Continue cholestyramine Repeat labs after:  3 months Lipid Liver function Patient was given information on [employer] - will sign patient up when PA approved Marital status Income < $75,000 (single)   Phillips Hay, PharmD CPP [facility] [address]  [place], [place] [address] [phone]  03/30/2023, 12:07 PM

## 2023-03-30 NOTE — Assessment & Plan Note (Signed)
Assessment: Patient with ASCVD/familial hyperlipidemia not at LDL goal of < 70 Most recent LDL 159 on 01/28/2023; was 192 (12/2021) Has been compliant with cholestyramine, knows this won't achieve goal, but wants to continue for GI benefits Not able to tolerate statins secondary to myalgias Reviewed options for lowering LDL cholesterol, including ezetimibe, PCSK-9 inhibitors, bempedoic acid and inclisiran.  Discussed mechanisms of action, dosing, side effects, potential decreases in LDL cholesterol and costs.  Also reviewed potential options for patient assistance.  Plan: Patient agreeable to starting Repatha  140 mg q14d Continue cholestyramine Repeat labs after:  3 months Lipid Liver function Patient was given information on [employer] - will sign patient up when PA approved Marital status Income < $75,000 (single)

## 2023-03-30 NOTE — Telephone Encounter (Signed)
Pharmacy Patient Advocate Encounter   Received notification from Pt Calls Messages that prior authorization for REPATHA is required/requested.   Insurance verification completed.   The patient is insured through [employer] .   Per test claim: PA required; PA submitted to above mentioned insurance via CoverMyMeds Key/confirmation #/EOC [id number] Status is pending

## 2023-03-30 NOTE — Patient Instructions (Signed)
Your Results:             Your most recent labs Goal  Total Cholesterol 234 < 200  Triglycerides 140 < 150  HDL (happy/good cholesterol) 49 > 40  LDL (lousy/bad cholesterol 159 < 70   Medication changes:  We will start the process to get Repatha covered by your insurance.  Once the prior authorization is complete, I will call/send a MyChart message to let you know and confirm pharmacy information.   You will take one injection every 14 day  Lab orders:  We want to repeat labs after 2-3 months.  We will send you a lab order to remind you once we get closer to that time.    Patient Assistance:    We will sign you up for a [employer] Grant once your medication is approved by [employer].  I will call you with the ID number, then you will take this information to the pharmacy.  They will bill it after your insurance, bringing your copay to $0.  The grant will pay the first $2,500 in a one year period.    ID   BIN [id number]  PCN [id number]  GRP [id number]    Thank you for choosing [facility]

## 2023-03-30 NOTE — Telephone Encounter (Signed)
 Please do PA for Repatha

## 2023-03-31 ENCOUNTER — Ambulatory Visit: Payer: Medicare Other

## 2023-03-31 ENCOUNTER — Other Ambulatory Visit (HOSPITAL_COMMUNITY): Payer: Self-pay

## 2023-03-31 MED ORDER — REPATHA SURECLICK 140 MG/ML ~~LOC~~ SOAJ: 140.0000 mg | SUBCUTANEOUS | 3 refills | Status: DC

## 2023-03-31 NOTE — Telephone Encounter (Signed)
Pharmacy Patient Advocate Encounter  Received notification from [facility] that Prior Authorization for repatha has been APPROVED from 03/30/23 to 09/27/23. Ran test claim, Copay is $35.00. This test claim was processed through [facility]- copay amounts may vary at other pharmacies due to pharmacy/plan contracts, or as the patient moves through the different stages of their insurance plan.   PA #/Case ID/Reference #: [id number]

## 2023-03-31 NOTE — Addendum Note (Signed)
Addended by: Rosalee Kaufman on: 03/31/2023 01:08 PM   Modules accepted: Orders

## 2023-06-29 ENCOUNTER — Encounter: Payer: Self-pay | Admitting: Pharmacist Clinician (PhC)/ Clinical Pharmacy Specialist

## 2023-06-29 DIAGNOSIS — E7849 Other hyperlipidemia: Secondary | ICD-10-CM

## 2023-07-03 LAB — LIPID PANEL
Chol/HDL Ratio: 2.2 ratio (ref 0.0–4.4)
Cholesterol, Total: 106 mg/dL (ref 100–199)
HDL: 49 mg/dL (ref >39)
LDL Chol Calc (NIH): 35 mg/dL (ref 0–99)
Triglycerides: 128 mg/dL (ref 0–149)
VLDL Cholesterol Cal: 22 mg/dL (ref 5–40)

## 2023-07-04 ENCOUNTER — Ambulatory Visit (HOSPITAL_BASED_OUTPATIENT_CLINIC_OR_DEPARTMENT_OTHER): Payer: Self-pay | Admitting: Pharmacist Clinician (PhC)/ Clinical Pharmacy Specialist

## 2023-12-03 ENCOUNTER — Telehealth: Payer: Self-pay | Admitting: Pharmacy Technician

## 2023-12-03 NOTE — Telephone Encounter (Signed)
  .  Pharmacy Patient Advocate Encounter   Received notification from Onbase that prior authorization for repatha  is required/requested.   Insurance verification completed.   The patient is insured through [employer].   Per test claim: PA required; PA submitted to above mentioned insurance via Latent Key/confirmation #/EOC [id number] Status is pending

## 2023-12-03 NOTE — Telephone Encounter (Signed)
 Pharmacy Patient Advocate Encounter  Received notification from [facility] that Prior Authorization for REPATHA  has been APPROVED from 12/03/23 to 02/08/25   PA #/Case ID/Reference #: [id number]

## 2023-12-30 ENCOUNTER — Telehealth: Payer: Self-pay | Admitting: Cardiovascular Disease

## 2023-12-30 NOTE — Telephone Encounter (Signed)
 Pt c/o BP issue: STAT if pt c/o blurred vision, one-sided weakness or slurred speech.   STAT if BP is GREATER than 180/120 TODAY.    STAT if BP is LESS than 90/60 and SYMPTOMATIC TODAY   1. What is your BP concern? Elevated    2. Have you taken any BP medication today? yes   3. What are your last 5 BP readings? 184/72  157/79 158/68 139/73 181/82 148/69 163/69 153/69 159/67   4. Are you having any other symptoms (ex. Dizziness, headache, blurred vision, passed out)? no

## 2023-12-30 NOTE — Telephone Encounter (Signed)
 Pt called regarding high blood pressures for the last week. Was first noticed at Gastrologist. 140-150 SBP. Dizzy spell last week for about 10-15 minutes. Currently asymptomatic. Pt advised to continue taking blood pressures every day and keeping a log. Appointment made for follow-up and advised patient that her BPs and message will be sent to Dr. [name].

## 2023-12-30 NOTE — Telephone Encounter (Signed)
 Pt called back and updated to increase her Lisinopril  to 40mg  daily. Also advised pt to see her PCP and pt stated she wouldn't be able to get in for 6 months. Earliest APP appt was 12/31 and pt refused to make one that close to her office visit already scheduled. Pt verbalizes understanding and reminded pt to take her BP every day 1-2 hours after taking BP medications.  Earvin Blazier RN

## 2024-02-04 ENCOUNTER — Telehealth: Payer: Self-pay | Admitting: Cardiovascular Disease

## 2024-02-04 MED ORDER — LISINOPRIL 40 MG PO TABS: 40.0000 mg | ORAL_TABLET | Freq: Every day | ORAL | 0 refills | Status: AC

## 2024-02-04 NOTE — Telephone Encounter (Signed)
" °*  STAT* If patient is at the pharmacy, call can be transferred to refill team.   1. Which medications need to be refilled? (please list name of each medication and dose if known)   lisinopril  (PRINIVIL ,ZESTRIL ) 20 MG tablet     2. Would you like to learn more about the convenience, safety, & potential cost savings by using the [facility]? NO    3. Are you open to using the [facility] ([facility]. NO    4. Which pharmacy/location (including street and city if local pharmacy) is medication to be sent to? [id number] - [place] - [address]     5. Do they need a 30 day or 90 day supply? 90 day   Pt out of medication.  Pt will need a new Rx to reflect dosage change. Pt now take 40mg  daily.  "

## 2024-02-04 NOTE — Telephone Encounter (Signed)
 [name] Darryle Ned, MD to [facility] Triage   12/30/23  2:41 PM Increase lisinopril  to 40 mg daily. I have not seen since January. Should get in for APP or see PCP after increasing the dose.    Signed, [name]. [name], MD, [facility]  [facility]  [address] [place], [place] [address] [phone]  2:41 PM  Will refill at increased dose.

## 2024-02-18 NOTE — Progress Notes (Unsigned)
" °  Cardiology Office Note:  .   Date:  02/18/2024  ID:  [name] Riley, DOB 1950-03-05, MRN [id number] PCP: Claudene Pellet, MD  [facility] Providers Cardiologist:  None   History of Present Illness: .   No chief complaint on file.   [name] Riley is a 74 y.o. female with below history who presents for follow-up.   History of Present Illness               Problem List CAD -CAC 730 (92nd percentile) -mild LAD 25-49%; mild LCX 25-49%; minimal RCA 1-24% 2. HTN 3. HLD -T chol 106, HDL 49, LDL 35, TG 128 4. Hepatic steatosis    ROS: All other ROS reviewed and negative. Pertinent positives noted in the HPI.     Studies Reviewed: SABRA        TTE 03/22/2023  1. Left ventricular ejection fraction, by estimation, is 60 to 65%. The  left ventricle has normal function. The left ventricle has no regional  wall motion abnormalities. There is mild left ventricular hypertrophy.  Left ventricular diastolic parameters  are consistent with Grade I diastolic dysfunction (impaired relaxation).   2. Right ventricular systolic function is normal. The right ventricular  size is normal. Tricuspid regurgitation signal is inadequate for assessing  PA pressure.   3. The mitral valve is grossly normal. Trivial mitral valve  regurgitation.   4. The aortic valve is tricuspid. Aortic valve regurgitation is trivial.  Aortic valve sclerosis is present, with no evidence of aortic valve  stenosis.   5. The inferior vena cava is normal in size with greater than 50%  respiratory variability, suggesting right atrial pressure of 3 mmHg.   CT FFR 03/11/2023 FINDINGS: LAD: Normal proximal 0.96, mid 0.93, distal 0.87   RCA: Normal proximal 0.98, mid 0.94, distal 0.94   LCX: Normal 0.96 proximal and 0.94 distal   D1 normal 0.93   IMPRESSION: Normal FFR CT suggesting no obstructive CAD   Physical Exam:   VS:  There were no vitals taken for this visit.   Wt Readings from Last 3 Encounters:   03/23/23 154 lb (69.9 kg)  02/23/23 154 lb (69.9 kg)  11/22/20 157 lb (71.2 kg)    GEN: Well nourished, well developed in no acute distress NECK: No JVD; No carotid bruits CARDIAC: ***RRR, no murmurs, rubs, gallops RESPIRATORY:  Clear to auscultation without rales, wheezing or rhonchi  ABDOMEN: Soft, non-tender, non-distended EXTREMITIES:  No edema; No deformity  ASSESSMENT AND PLAN: .   Assessment and Plan                 {Are you ordering a CV Procedure (e.g. stress test, cath, DCCV, TEE, etc)?   Press F2        :[id number]}   Follow-up: No follow-ups on file.  Signed, [name]. [name], MD, [facility]  [facility]  [address] [place], [place] [address] [phone]  12:19 PM   "

## 2024-02-24 ENCOUNTER — Ambulatory Visit: Attending: Cardiovascular Disease | Admitting: Cardiovascular Disease

## 2024-02-24 ENCOUNTER — Encounter: Payer: Self-pay | Admitting: Cardiovascular Disease

## 2024-02-24 VITALS — BP 150/60 | HR 53 | Ht 61.0 in | Wt 149.0 lb

## 2024-02-24 DIAGNOSIS — I2510 Atherosclerotic heart disease of native coronary artery without angina pectoris: Secondary | ICD-10-CM | POA: Diagnosis not present

## 2024-02-24 DIAGNOSIS — R931 Abnormal findings on diagnostic imaging of heart and coronary circulation: Secondary | ICD-10-CM

## 2024-02-24 DIAGNOSIS — E782 Mixed hyperlipidemia: Secondary | ICD-10-CM | POA: Diagnosis not present

## 2024-02-24 MED ORDER — AMLODIPINE BESYLATE 10 MG PO TABS: 10.0000 mg | ORAL_TABLET | Freq: Every day | ORAL | 3 refills | Status: AC

## 2024-02-24 MED ORDER — REPATHA SURECLICK 140 MG/ML ~~LOC~~ SOAJ: 140.0000 mg | SUBCUTANEOUS | 3 refills | Status: AC

## 2024-02-24 NOTE — Patient Instructions (Addendum)
 Medication Instructions:  Your physician has recommended you make the following change in your medication:   -Start amlodipine  (norvasc ) 10mg  once daily.  *If you need a refill on your cardiac medications before your next appointment, please call your pharmacy*   Follow-Up: At [facility], you and your health needs are our priority.  As part of our continuing mission to provide you with exceptional heart care, our providers are all part of one team.  This team includes your primary Cardiologist (physician) and Advanced Practice Providers or APPs (Physician Assistants and Nurse Practitioners) who all work together to provide you with the care you need, when you need it.  Your next appointment:   3 month(s)  Provider:   Lum Louis, NP, Aline Door, PA-C, Kathleen Johnson, PA-C, Hao Meng, PA-C, Damien Braver, NP, or Katlyn West, NP         Then, Darryle ONEIDA Decent, MD will plan to see you again in 12 month(s).    We recommend signing up for the patient portal called MyChart.  Sign up information is provided on this After Visit Summary.  MyChart is used to connect with patients for Virtual Visits (Telemedicine).  Patients are able to view lab/test results, encounter notes, upcoming appointments, etc.  Non-urgent messages can be sent to your provider as well.   To learn more about what you can do with MyChart, go to [email or web address].   Other Instructions Please take your blood pressure daily and include heart rates. Please bring your log to your follow up appointment.  HOW TO TAKE YOUR BLOOD PRESSURE: Rest 5 minutes before taking your blood pressure. Dont smoke or drink caffeinated beverages for at least 30 minutes before. Take your blood pressure before (not after) you eat. Sit comfortably with your back supported and both feet on the floor (dont cross your legs). Elevate your arm to heart level on a table or a desk. Use the proper sized cuff. It should fit  smoothly and snugly around your bare upper arm. There should be enough room to slip a fingertip under the cuff. The bottom edge of the cuff should be 1 inch above the crease of the elbow. Ideally, take 3 measurements at one sitting and record the average.  Blood Pressure Log Date/Time Medications taken? (Y/N) Blood Pressure Heart Rate/Pulse

## 2024-05-22 ENCOUNTER — Ambulatory Visit: Admitting: Cardiology
